# Patient Record
Sex: Female | Born: 1935 | Race: Black or African American | Hispanic: No | Marital: Married | Smoking: Never smoker
Health system: Southern US, Community
[De-identification: ages and names within clinical notes are randomized; demographics above are authoritative.]

## PROBLEM LIST (undated history)

## (undated) DIAGNOSIS — J449 Chronic obstructive pulmonary disease, unspecified: Secondary | ICD-10-CM

## (undated) DIAGNOSIS — K219 Gastro-esophageal reflux disease without esophagitis: Secondary | ICD-10-CM

## (undated) DIAGNOSIS — I441 Atrioventricular block, second degree: Secondary | ICD-10-CM

## (undated) DIAGNOSIS — K449 Diaphragmatic hernia without obstruction or gangrene: Secondary | ICD-10-CM

## (undated) DIAGNOSIS — Z950 Presence of cardiac pacemaker: Secondary | ICD-10-CM

## (undated) DIAGNOSIS — I10 Essential (primary) hypertension: Secondary | ICD-10-CM

## (undated) HISTORY — DX: Essential (primary) hypertension: I10

## (undated) HISTORY — DX: Diaphragmatic hernia without obstruction or gangrene: K44.9

## (undated) HISTORY — DX: Gastro-esophageal reflux disease without esophagitis: K21.9

## (undated) HISTORY — PX: PACEMAKER PLACEMENT: SHX43

## (undated) HISTORY — DX: Atrioventricular block, second degree: I44.1

## (undated) HISTORY — PX: KNEE SURGERY: SHX244

## (undated) HISTORY — DX: Chronic obstructive pulmonary disease, unspecified: J44.9

---

## 1998-12-20 ENCOUNTER — Ambulatory Visit (HOSPITAL_BASED_OUTPATIENT_CLINIC_OR_DEPARTMENT_OTHER): Admission: RE | Admit: 1998-12-20 | Discharge: 1998-12-21 | Payer: Self-pay | Admitting: Orthopedic Surgery

## 1998-12-20 HISTORY — PX: ESOPHAGOGASTRODUODENOSCOPY: SHX1529

## 1999-01-03 ENCOUNTER — Encounter: Admission: RE | Admit: 1999-01-03 | Discharge: 1999-01-30 | Payer: Self-pay | Admitting: Orthopedic Surgery

## 1999-06-13 ENCOUNTER — Ambulatory Visit (HOSPITAL_COMMUNITY): Admission: RE | Admit: 1999-06-13 | Discharge: 1999-06-13 | Payer: Self-pay | Admitting: Family Medicine

## 1999-11-20 ENCOUNTER — Encounter: Admission: RE | Admit: 1999-11-20 | Discharge: 1999-11-20 | Payer: Self-pay | Admitting: Family Medicine

## 1999-11-20 ENCOUNTER — Encounter: Payer: Self-pay | Admitting: Family Medicine

## 2000-02-15 ENCOUNTER — Ambulatory Visit (HOSPITAL_COMMUNITY): Admission: RE | Admit: 2000-02-15 | Discharge: 2000-02-15 | Payer: Self-pay | Admitting: Pulmonary Disease

## 2000-02-21 ENCOUNTER — Encounter: Payer: Self-pay | Admitting: Pulmonary Disease

## 2000-02-21 ENCOUNTER — Ambulatory Visit (HOSPITAL_COMMUNITY): Admission: RE | Admit: 2000-02-21 | Discharge: 2000-02-21 | Payer: Self-pay | Admitting: Pulmonary Disease

## 2000-12-09 ENCOUNTER — Encounter: Admission: RE | Admit: 2000-12-09 | Discharge: 2000-12-09 | Payer: Self-pay | Admitting: Family Medicine

## 2000-12-09 ENCOUNTER — Encounter: Payer: Self-pay | Admitting: Family Medicine

## 2000-12-12 ENCOUNTER — Ambulatory Visit (HOSPITAL_COMMUNITY): Admission: RE | Admit: 2000-12-12 | Discharge: 2000-12-12 | Payer: Self-pay

## 2001-04-15 ENCOUNTER — Encounter: Admission: RE | Admit: 2001-04-15 | Discharge: 2001-04-15 | Payer: Self-pay | Admitting: Family Medicine

## 2001-04-15 ENCOUNTER — Encounter: Payer: Self-pay | Admitting: Family Medicine

## 2001-09-29 ENCOUNTER — Ambulatory Visit (HOSPITAL_COMMUNITY): Admission: RE | Admit: 2001-09-29 | Discharge: 2001-09-29 | Payer: Self-pay | Admitting: Cardiology

## 2002-02-11 ENCOUNTER — Encounter: Payer: Self-pay | Admitting: Family Medicine

## 2002-02-11 ENCOUNTER — Encounter: Admission: RE | Admit: 2002-02-11 | Discharge: 2002-02-11 | Payer: Self-pay | Admitting: Family Medicine

## 2002-06-24 ENCOUNTER — Encounter: Payer: Self-pay | Admitting: Emergency Medicine

## 2002-06-24 ENCOUNTER — Emergency Department (HOSPITAL_COMMUNITY): Admission: EM | Admit: 2002-06-24 | Discharge: 2002-06-24 | Payer: Self-pay | Admitting: Emergency Medicine

## 2002-06-26 ENCOUNTER — Ambulatory Visit (HOSPITAL_BASED_OUTPATIENT_CLINIC_OR_DEPARTMENT_OTHER): Admission: RE | Admit: 2002-06-26 | Discharge: 2002-06-26 | Payer: Self-pay | Admitting: Pulmonary Disease

## 2002-07-26 ENCOUNTER — Encounter: Admission: RE | Admit: 2002-07-26 | Discharge: 2002-07-26 | Payer: Self-pay | Admitting: Orthopedic Surgery

## 2002-07-26 ENCOUNTER — Encounter: Payer: Self-pay | Admitting: Orthopedic Surgery

## 2002-09-22 ENCOUNTER — Ambulatory Visit (HOSPITAL_COMMUNITY): Admission: RE | Admit: 2002-09-22 | Discharge: 2002-09-22 | Payer: Self-pay | Admitting: Gastroenterology

## 2002-09-22 HISTORY — PX: COLONOSCOPY: SHX174

## 2003-02-15 ENCOUNTER — Encounter: Admission: RE | Admit: 2003-02-15 | Discharge: 2003-02-15 | Payer: Self-pay | Admitting: Family Medicine

## 2004-05-03 ENCOUNTER — Encounter: Admission: RE | Admit: 2004-05-03 | Discharge: 2004-05-03 | Payer: Self-pay | Admitting: Family Medicine

## 2004-09-21 ENCOUNTER — Ambulatory Visit (HOSPITAL_COMMUNITY): Admission: RE | Admit: 2004-09-21 | Discharge: 2004-09-21 | Payer: Self-pay | Admitting: Cardiology

## 2005-01-23 ENCOUNTER — Inpatient Hospital Stay (HOSPITAL_COMMUNITY): Admission: EM | Admit: 2005-01-23 | Discharge: 2005-01-25 | Payer: Self-pay | Admitting: Emergency Medicine

## 2005-01-25 ENCOUNTER — Encounter (INDEPENDENT_AMBULATORY_CARE_PROVIDER_SITE_OTHER): Payer: Self-pay | Admitting: Cardiology

## 2005-02-21 ENCOUNTER — Encounter: Admission: RE | Admit: 2005-02-21 | Discharge: 2005-05-22 | Payer: Self-pay | Admitting: Family Medicine

## 2005-05-22 ENCOUNTER — Ambulatory Visit (HOSPITAL_COMMUNITY): Admission: RE | Admit: 2005-05-22 | Discharge: 2005-05-22 | Payer: Self-pay | Admitting: Sports Medicine

## 2005-05-22 ENCOUNTER — Encounter: Admission: RE | Admit: 2005-05-22 | Discharge: 2005-07-20 | Payer: Self-pay | Admitting: Sports Medicine

## 2005-05-24 ENCOUNTER — Encounter: Admission: RE | Admit: 2005-05-24 | Discharge: 2005-08-22 | Payer: Self-pay | Admitting: Family Medicine

## 2005-05-28 ENCOUNTER — Encounter: Admission: RE | Admit: 2005-05-28 | Discharge: 2005-05-28 | Payer: Self-pay | Admitting: Family Medicine

## 2005-07-21 ENCOUNTER — Emergency Department (HOSPITAL_COMMUNITY): Admission: EM | Admit: 2005-07-21 | Discharge: 2005-07-21 | Payer: Self-pay | Admitting: Emergency Medicine

## 2006-04-16 ENCOUNTER — Encounter: Admission: RE | Admit: 2006-04-16 | Discharge: 2006-04-16 | Payer: Self-pay | Admitting: Family Medicine

## 2006-07-25 ENCOUNTER — Encounter: Admission: RE | Admit: 2006-07-25 | Discharge: 2006-07-25 | Payer: Self-pay | Admitting: Family Medicine

## 2007-11-12 ENCOUNTER — Encounter: Admission: RE | Admit: 2007-11-12 | Discharge: 2007-11-12 | Payer: Self-pay | Admitting: Family Medicine

## 2008-02-04 ENCOUNTER — Ambulatory Visit: Payer: Self-pay | Admitting: Pulmonary Disease

## 2008-02-04 DIAGNOSIS — K219 Gastro-esophageal reflux disease without esophagitis: Secondary | ICD-10-CM | POA: Insufficient documentation

## 2008-04-12 ENCOUNTER — Other Ambulatory Visit: Admission: RE | Admit: 2008-04-12 | Discharge: 2008-04-12 | Payer: Self-pay | Admitting: Obstetrics and Gynecology

## 2008-04-29 ENCOUNTER — Ambulatory Visit: Payer: Self-pay | Admitting: Surgery

## 2008-04-29 ENCOUNTER — Ambulatory Visit: Admission: RE | Admit: 2008-04-29 | Discharge: 2008-04-29 | Payer: Self-pay | Admitting: Cardiology

## 2008-04-29 ENCOUNTER — Encounter (INDEPENDENT_AMBULATORY_CARE_PROVIDER_SITE_OTHER): Payer: Self-pay | Admitting: Cardiology

## 2008-05-25 ENCOUNTER — Encounter: Payer: Self-pay | Admitting: Internal Medicine

## 2008-07-28 ENCOUNTER — Encounter: Payer: Self-pay | Admitting: Internal Medicine

## 2008-08-12 ENCOUNTER — Encounter: Payer: Self-pay | Admitting: Internal Medicine

## 2008-09-07 ENCOUNTER — Encounter: Payer: Self-pay | Admitting: Internal Medicine

## 2008-10-07 ENCOUNTER — Encounter: Payer: Self-pay | Admitting: Internal Medicine

## 2008-10-21 DIAGNOSIS — I10 Essential (primary) hypertension: Secondary | ICD-10-CM | POA: Insufficient documentation

## 2008-10-21 DIAGNOSIS — Z8719 Personal history of other diseases of the digestive system: Secondary | ICD-10-CM

## 2008-10-22 ENCOUNTER — Ambulatory Visit: Payer: Self-pay | Admitting: Internal Medicine

## 2008-10-22 DIAGNOSIS — I498 Other specified cardiac arrhythmias: Secondary | ICD-10-CM

## 2008-11-09 ENCOUNTER — Telehealth (INDEPENDENT_AMBULATORY_CARE_PROVIDER_SITE_OTHER): Payer: Self-pay | Admitting: *Deleted

## 2008-12-08 ENCOUNTER — Encounter: Payer: Self-pay | Admitting: Internal Medicine

## 2009-02-11 ENCOUNTER — Encounter: Admission: RE | Admit: 2009-02-11 | Discharge: 2009-02-11 | Payer: Self-pay | Admitting: Family Medicine

## 2010-02-13 ENCOUNTER — Encounter
Admission: RE | Admit: 2010-02-13 | Discharge: 2010-02-13 | Payer: Self-pay | Source: Home / Self Care | Attending: Family Medicine | Admitting: Family Medicine

## 2010-03-09 ENCOUNTER — Ambulatory Visit
Admission: RE | Admit: 2010-03-09 | Discharge: 2010-03-09 | Payer: Self-pay | Source: Home / Self Care | Attending: Critical Care Medicine | Admitting: Critical Care Medicine

## 2010-03-09 ENCOUNTER — Ambulatory Visit (HOSPITAL_BASED_OUTPATIENT_CLINIC_OR_DEPARTMENT_OTHER)
Admission: RE | Admit: 2010-03-09 | Discharge: 2010-03-09 | Payer: Self-pay | Source: Home / Self Care | Attending: Critical Care Medicine | Admitting: Critical Care Medicine

## 2010-03-09 ENCOUNTER — Telehealth: Payer: Self-pay | Admitting: Critical Care Medicine

## 2010-03-09 DIAGNOSIS — G4733 Obstructive sleep apnea (adult) (pediatric): Secondary | ICD-10-CM | POA: Insufficient documentation

## 2010-03-20 ENCOUNTER — Encounter: Payer: Self-pay | Admitting: Family Medicine

## 2010-03-30 NOTE — Progress Notes (Signed)
Summary: CXR normal  Phone Note Outgoing Call   Reason for Call: Discuss lab or test results Summary of Call: call the pt and tell her CXR normal, no active disease, no pneumonia Initial call taken by: Storm Frisk MD,  March 09, 2010 2:58 PM  Follow-up for Phone Call        [facility]  Crystal Yetta Barre RN  March 09, 2010 3:33 PM  Called, spoke with pt.  She was informed of above cxr results per PW and verbalized understanding. Follow-up by: Gweneth Dimitri RN,  March 10, 2010 1:49 PM

## 2010-03-30 NOTE — Assessment & Plan Note (Signed)
Summary: Pulmonary Consultation   Copy to:  Dr. Catha Gosselin Primary Provider/Referring Provider:  Dr. Clarene Duke  CC:  Pulmonary Follow up - abnormal PFT, SOB., and dyspnea.  History of Present Illness: Pulmonary Consultation       This is a 77 year old female who presents with dyspnea.  The patient complains of shortness of breath, but denies history of diagnosed COPD, chest tightness, chest pain worse with breathing and coughing, wheezing, cough, mucous production, nocturnal awakening, exercise induced symptoms, and congestion.  The cough is described as insignificant.  The dyspnea is described as dyspnea at rest, dyspnea with exertion, worse with inspiration, and occurs while bending over.  The patient denies any history of asthma, allergic rhinitis, COPD, sleep disordered breathing, obstructive sleep apnea, heart disease, thyroid disease, anemia, collagen vascular disease, osteporosis, kyphoscoliosis, occupational exposure: , and cancer: .         Ineffective therapies have included oral inhaled steroids.    This pt has been dyspneic for 6-35months ,  getting worse overtime.  Pt not able to sleep at night. ?irriated throat from inhaler. no cough,  just slow breathing no real chest pain  pfts show severe obstrution Pt was on symbicort and this irritated the throat Pt on diflucan for oral thrush   Preventive Screening-Counseling & Management  Alcohol-Tobacco     Smoking Status: never  Current Medications (verified): 1)  Nexium 40 Mg Cpdr (Esomeprazole Magnesium) .... Take 1 Capsule By Mouth Once A Day 2)  Klor-Con 20 Meq Pack (Potassium Chloride) .... Take 1 Tablet By Mouth Two Times A Day 3)  Metoclopramide Hcl 5 Mg Tabs (Metoclopramide Hcl) .... Take 1 Tablet By Mouth Two Times A Day 4)  Alprazolam 0.5 Mg Tabs (Alprazolam) .... 1/2 Tablet At Bedtime 5)  Furosemide 20 Mg Tabs (Furosemide) .... Take 1 Tablet By Mouth Two Times A Day 6)  Clonidine Hcl 0.2 Mg Tabs (Clonidine Hcl) ....  Take 1 Tablet By Mouth Three Times A Day 7)  Aspirin 81 Mg Tbec (Aspirin) .... Take One Tablet By Mouth Daily 8)  Metamucil 30.9 % Powd (Psyllium) .... Two Times A Day 9)  Metoprolol Tartrate 50 Mg Tabs (Metoprolol Tartrate) .... Take 1 Tablet By Mouth Once A Day 10)  Lisinopril 20 Mg Tabs (Lisinopril) .... 2 Tablets in Am and 1 in Evening.  Allergies: 1)  ! Sulfa 2)  ! * Latex 3)  ! Penicillin 4)  ! Codeine 5)  * Latex  Past History:  Past medical, surgical, family and social histories (including risk factors) reviewed, and no changes noted (except as noted below).  Past Medical History: Reviewed history from 10/22/2008 and no changes required. HYPERTENSION (ICD-401.9) HIATAL HERNIA, HX OF (ICD-V12.79) G E R D (ICD-530.81) Mobitz I second degree AV block  caratacts removed  Past Surgical History:  PHYSICIAN:  Petra Kuba, M.D.                   DATE OF PROCEDURE:  09/22/2002                                  OPERATIVE REPORT   PROCEDURE:  Colonoscopy.    PHYSICIAN:  Petra Kuba, M.D.            DATE OF PROCEDURE:  09/22/2002  OPERATIVE REPORT   PROCEDURE:  Esophagogastroduodenoscopy.  Attending Physician:  Cornell Barman Proc. Date: 12/20/98 Admit Date:  12/20/1998                             Operative Report  PREOPERATIVE DIAGNOSIS:  Partial rotator cuff tear, right shoulder.  POSTOPERATIVE DIAGNOSIS:  Partial rotator cuff tear, right shoulder.  OPERATION:  Neer anterior one third acromioplasty and repair partial tear supraspinatus, right shoulder.    right and left knee surgery pacemaker  Family History: Reviewed history from 10/22/2008 and no changes required.  The patient's mother died of breast cancer at age 61.  Dad  died of CVA and hypertension complications at age 80.  A half sister died of  CVA and hypertension.  Social History: Reviewed history from 10/22/2008 and no changes required. Pt lives  in [place] with her spouse.   Retired, previously worked in Designer, fashion/clothing.  She denies tobacco, ETOH, drugs  Review of Systems       The patient complains of shortness of breath with activity, shortness of breath at rest, irregular heartbeats, indigestion, sore throat, nasal congestion/difficulty breathing through nose, anxiety, depression, hand/feet swelling, and joint stiffness or pain.  The patient denies productive cough, non-productive cough, coughing up blood, chest pain, acid heartburn, loss of appetite, weight change, abdominal pain, difficulty swallowing, tooth/dental problems, headaches, sneezing, itching, ear ache, rash, change in color of mucus, and fever.        See HPI for Pulmonary, Cardiac, General, and ENT review of systems.  Vital Signs:  Patient profile:   77 year old female Height:      64 inches Weight:      194 pounds BMI:     33.42 O2 Sat:      99 % on Room air Temp:     97.9 degrees F oral Pulse rate:   60 / minute BP sitting:   130 / 60  (right arm) Cuff size:   large  Vitals Entered By: Gweneth Dimitri RN (March 09, 2010 1:28 PM)  O2 Flow:  Room air CC: Pulmonary Follow up - abnormal PFT, SOB., dyspnea Comments Medications reviewed with patient Daytime contact number verified with patient. Gweneth Dimitri RN  March 09, 2010 1:35 PM    Physical Exam  Additional Exam:  Gen: Pleasant, well-nourished, in no distress,  normal affect ENT: No lesions,  mouth clear,  oropharynx clear, no postnasal drip Neck: No JVD, no TMG, no carotid bruits Lungs: No use of accessory muscles, no dullness to percussion, distant BS Cardiovascular: RRR, heart sounds normal, no murmur or gallops, no peripheral edema Abdomen: soft and NT, no HSM,  BS normal Musculoskeletal: No deformities, no cyanosis or clubbing Neuro: alert, non focal Skin: Warm, no lesions or rashes    CXR  Procedure date:  03/09/2010  Findings:      IMPRESSION: No active disease.  Pulmonary  Function Test Date: 03/06/2010 Gender: Female  Pre-Spirometry FVC    Value: 1.17 L/min   Pred: 2.31 L/min     % Pred: 51 % FEV1    Value: 0.96 L     Pred: 1.58 L     % Pred: 61 % FEV1/FVC  Value: 70 %     Pred: 82 %    FEF 25-75  Value: 0.91 L/min   Pred: 2.03 L/min     % Pred: 45 %  Comments: severe airflow obstruction   Impression &  Recommendations:  Problem # 1:  DYSPNEA (ICD-786.05) Assessment Deteriorated Dyspnea on the basis of severe Copd,  no active pneumonia plan Trial Spiriva one capsule two puff daily Stop symbicort Finish diflucan Orders: Est. Patient Level V ([address]) T-2 View CXR (71020TC)  Medications Added to Medication List This Visit: 1)  Nexium 40 Mg Cpdr (Esomeprazole magnesium) .... Take 1 capsule by mouth once a day 2)  Klor-con 20 Meq Pack (Potassium chloride) .... Take 1 tablet by mouth two times a day 3)  Metoclopramide Hcl 5 Mg Tabs (Metoclopramide hcl) .... Take 1 tablet by mouth two times a day 4)  Alprazolam 0.5 Mg Tabs (Alprazolam) .... 1/2 tablet at bedtime 5)  Furosemide 20 Mg Tabs (Furosemide) .... Take 1 tablet by mouth two times a day 6)  Clonidine Hcl 0.2 Mg Tabs (Clonidine hcl) .... Take 1 tablet by mouth three times a day 7)  Metamucil 30.9 % Powd (Psyllium) .... Two times a day 8)  Metoprolol Tartrate 50 Mg Tabs (Metoprolol tartrate) .... Take 1 tablet by mouth once a day 9)  Lisinopril 20 Mg Tabs (Lisinopril) .... 2 tablets in am and 1 in evening. 10)  Spiriva Handihaler 18 Mcg Caps (Tiotropium bromide monohydrate) .... Two puffs in handihaler daily  Complete Medication List: 1)  Nexium 40 Mg Cpdr (Esomeprazole magnesium) .... Take 1 capsule by mouth once a day 2)  Klor-con 20 Meq Pack (Potassium chloride) .... Take 1 tablet by mouth two times a day 3)  Metoclopramide Hcl 5 Mg Tabs (Metoclopramide hcl) .... Take 1 tablet by mouth two times a day 4)  Alprazolam 0.5 Mg Tabs (Alprazolam) .... 1/2 tablet at bedtime 5)  Furosemide 20 Mg Tabs  (Furosemide) .... Take 1 tablet by mouth two times a day 6)  Clonidine Hcl 0.2 Mg Tabs (Clonidine hcl) .... Take 1 tablet by mouth three times a day 7)  Aspirin 81 Mg Tbec (Aspirin) .... Take one tablet by mouth daily 8)  Metamucil 30.9 % Powd (Psyllium) .... Two times a day 9)  Metoprolol Tartrate 50 Mg Tabs (Metoprolol tartrate) .... Take 1 tablet by mouth once a day 10)  Lisinopril 20 Mg Tabs (Lisinopril) .... 2 tablets in am and 1 in evening. 11)  Spiriva Handihaler 18 Mcg Caps (Tiotropium bromide monohydrate) .... Two puffs in handihaler daily  Patient Instructions: 1)  Trial Spiriva one capsule two puff daily 2)  Stop symbicort 3)  Finish diflucan 4)  Chest xray today 5)  Return 2 months for recheck Prescriptions: SPIRIVA HANDIHALER 18 MCG  CAPS (TIOTROPIUM BROMIDE MONOHYDRATE) Two puffs in handihaler daily  #30 x 6   Entered and Authorized by:   Storm Frisk MD   Signed by:   Storm Frisk MD on 03/09/2010   Method used:   Electronically to        [facility] [phone]* (retail)       [address]       [place], [place]  [address]       Ph: [phone]       Fax: [phone]   RxID:   [id number]    Immunization History:  Influenza Immunization History:    Influenza:  historical (01/26/2010)   Prevention & Chronic Care Immunizations   Influenza vaccine: Historical  (01/26/2010)    Tetanus booster: Not documented    Pneumococcal vaccine: Not documented    H. zoster vaccine: Not documented  Colorectal Screening   Hemoccult: Not documented  Colonoscopy: Not documented  Other Screening   Pap smear: Not documented    Mammogram: Not documented    DXA bone density scan: Not documented   Smoking status: never  (03/09/2010)  Lipids   Total Cholesterol: Not documented   LDL: Not documented   LDL Direct: Not documented   HDL: Not documented   Triglycerides: Not documented  Hypertension   Last Blood Pressure: 130 / 60  (03/09/2010)   Serum  creatinine: Not documented   Serum potassium Not documented  Self-Management Support :    Hypertension self-management support: Not documented  Appended Document: Pulmonary Consultation fax kevin little

## 2010-04-14 ENCOUNTER — Encounter: Payer: Self-pay | Admitting: Critical Care Medicine

## 2010-04-17 ENCOUNTER — Telehealth (INDEPENDENT_AMBULATORY_CARE_PROVIDER_SITE_OTHER): Payer: Self-pay | Admitting: *Deleted

## 2010-04-25 NOTE — Progress Notes (Signed)
Summary: side effects from spiriva  Phone Note Call from Patient Call back at Home Phone [phone]   Caller: Patient Call For: wright Summary of Call: Pt states she experiencing side effects from spiriva hoarseness, sore throat since 1/13 pls advise.//[facility] [place] Initial call taken by: Darletta Moll,  April 17, 2010 12:08 PM  Follow-up for Phone Call        Called, spoke with pt.  States she was seen at PCP office on Friday d/t sore throat and hoarseness.  Onset since starting spiriva on 03/09/10 - was told to call Dr. Delford Field by PCP's office bc sxs probably are side effect from spiriva.  Pt aware PW out of office but will have doc of day address.  Dr. Craige Cotta, pls advise.  Thanks! Follow-up by: Gweneth Dimitri RN,  April 17, 2010 1:33 PM  Additional Follow-up for Phone Call Additional follow up Details #1::        Please have her stop spiriva.  Send script for ventolin two puffs four times per day as needed for cough, wheeze, congestion, or shortness of breath.  Advise her to call if her symptoms get worse.  Will forward to Dr. Delford Field for his review. Additional Follow-up by: Coralyn Helling MD,  April 17, 2010 1:39 PM    Additional Follow-up for Phone Call Additional follow up Details #2::    Called, spoke with pt.  She was informed to stop spiriva and start ventolin 2 puffs qid as needed cough, wheezing, congestion, SOB per Dr. Craige Cotta.  She verbalized understanding of this and was advised to call office back if sxs worsen.  Aware ventolin rx sent to [facility] and will forward message to PW to reveiw of any further recs when he returns to office.  Pt verbalized understanding of these instructions. Follow-up by: Gweneth Dimitri RN,  April 17, 2010 1:51 PM  Additional Follow-up for Phone Call Additional follow up Details #3:: Details for Additional Follow-up Action Taken: agree , stop spiriva and make OV for appt Additional Follow-up by: Storm Frisk MD,  April 17, 2010 7:12 PM  New/Updated Medications: VENTOLIN HFA 108 (90 BASE) MCG/ACT AERS (ALBUTEROL SULFATE) 2 puffs four times daily as needed for cough, wheeze, congestion, shortness of breath Prescriptions: VENTOLIN HFA 108 (90 BASE) MCG/ACT AERS (ALBUTEROL SULFATE) 2 puffs four times daily as needed for cough, wheeze, congestion, shortness of breath  #1 x 3   Entered by:   Gweneth Dimitri RN   Authorized by:   Coralyn Helling MD   Signed by:   Gweneth Dimitri RN on 04/17/2010   Method used:   Electronically to        [facility] [phone]* (retail)       [address]       [place], [place]  [address]       Ph: [phone]       Fax: [phone]   RxID:   [id number]  Pt informed Dr Delford Field okay with change over to Ventolin.  Pt will call back to schedule f/u appt. Abigail Miyamoto RN  April 18, 2010 8:53 AM

## 2010-05-04 NOTE — Letter (Signed)
Summary: Gweneth Dimitri MD/[facility]  Gweneth Dimitri MD/[facility]   Imported By: Lester Belk 04/24/2010 09:33:18  _____________________________________________________________________  External Attachment:    Type:   Image     Comment:   External Document

## 2010-05-11 ENCOUNTER — Ambulatory Visit (INDEPENDENT_AMBULATORY_CARE_PROVIDER_SITE_OTHER): Payer: Medicare PPO | Admitting: Critical Care Medicine

## 2010-05-11 ENCOUNTER — Encounter: Payer: Self-pay | Admitting: Critical Care Medicine

## 2010-05-11 DIAGNOSIS — J40 Bronchitis, not specified as acute or chronic: Secondary | ICD-10-CM

## 2010-05-25 NOTE — Assessment & Plan Note (Signed)
Summary: Pulmonary OV   Copy to:  Dr. Catha Gosselin Primary Provider/Referring Provider:  Dr. Clarene Duke  CC:  2 month follow up.  Pt states breathing is better when she uses ventolin.  Cough - prod at times with a small amount of white mucus.  .  History of Present Illness: Pulmonary OV  Chronic dyspnea.  Cycical cough.  Severe obstruction on PFTs       Ineffective therapies have included oral inhaled steroids.    This pt has been dyspneic for 6-36months ,  getting worse overtime.  Pt not able to sleep at night. ?irriated throat from inhaler. no cough,  just slow breathing no real chest pain  pfts show severe obstrution Pt was on symbicort and this irritated the throat Pt on diflucan for oral thrush   May 11, 2010 4:17 PM Not able to tolerate spiriva due to side effects.  No real cough.  Dyspnea is at baseline. Pt intolerant of all HFA/DPI inhalers.  Clinical Reports Reviewed:  PFT's:  03/09/2010: FEF 25/75 %Predicted:  45 FEV1 %Predicted:  61 FVC %Predicted:  51   Current Medications (verified): 1)  Nexium 40 Mg Cpdr (Esomeprazole Magnesium) .... Take 1 Capsule By Mouth Once A Day 2)  Klor-Con 20 Meq Pack (Potassium Chloride) .... Take 1 Tablet By Mouth Two Times A Day 3)  Metoclopramide Hcl 5 Mg Tabs (Metoclopramide Hcl) .... Take 1 Tablet By Mouth Two Times A Day 4)  Alprazolam 0.5 Mg Tabs (Alprazolam) .... 1/2 Tablet At Bedtime 5)  Furosemide 20 Mg Tabs (Furosemide) .... Take 1 Tablet By Mouth Two Times A Day 6)  Clonidine Hcl 0.2 Mg Tabs (Clonidine Hcl) .... Take 1 Tablet By Mouth Three Times A Day 7)  Aspirin 81 Mg Tbec (Aspirin) .... Take One Tablet By Mouth Daily 8)  Metamucil 30.9 % Powd (Psyllium) .... Two Times A Day 9)  Metoprolol Tartrate 50 Mg Tabs (Metoprolol Tartrate) .... Take 1 Tablet By Mouth Once A Day 10)  Lisinopril 20 Mg Tabs (Lisinopril) .... 2 Tablets in Am and 1 in Evening. 11)  Ventolin Hfa 108 (90 Base) Mcg/act Aers (Albuterol Sulfate) .... 2  Puffs Four Times Daily As Needed For Cough, Wheeze, Congestion, Shortness of Breath  Allergies (verified): 1)  ! Sulfa 2)  ! * Latex 3)  ! Penicillin 4)  ! Codeine 5)  * Latex  Past History:  Past medical, surgical, family and social histories (including risk factors) reviewed, and no changes noted (except as noted below).  Past Medical History: HYPERTENSION (ICD-401.9) HIATAL HERNIA, HX OF (ICD-V12.79) G E R D (ICD-530.81) Mobitz I second degree AV block  caratacts removed Moderate COPD   FeV1 Moderate obstruction  Past Surgical History: Reviewed history from 03/09/2010 and no changes required.  PHYSICIAN:  Petra Kuba, M.D.                   DATE OF PROCEDURE:  09/22/2002                                  OPERATIVE REPORT   PROCEDURE:  Colonoscopy.    PHYSICIAN:  Petra Kuba, M.D.            DATE OF PROCEDURE:  09/22/2002  OPERATIVE REPORT   PROCEDURE:  Esophagogastroduodenoscopy.  Attending Physician:  Cornell Barman Proc. Date: 12/20/98 Admit Date:  12/20/1998                             Operative Report  PREOPERATIVE DIAGNOSIS:  Partial rotator cuff tear, right shoulder.  POSTOPERATIVE DIAGNOSIS:  Partial rotator cuff tear, right shoulder.  OPERATION:  Neer anterior one third acromioplasty and repair partial tear supraspinatus, right shoulder.    right and left knee surgery pacemaker  Family History: Reviewed history from 10/22/2008 and no changes required.  The patient's mother died of breast cancer at age 89.  Dad  died of CVA and hypertension complications at age 66.  A half sister died of  CVA and hypertension.  Social History: Reviewed history from 03/09/2010 and no changes required. Pt lives in [place] with her spouse.   Retired, previously worked in Designer, fashion/clothing.  She denies tobacco, ETOH, drugs  Review of Systems       The patient complains of shortness of breath with activity and  productive cough.  The patient denies shortness of breath at rest, non-productive cough, coughing up blood, chest pain, irregular heartbeats, acid heartburn, indigestion, loss of appetite, weight change, abdominal pain, difficulty swallowing, sore throat, tooth/dental problems, headaches, nasal congestion/difficulty breathing through nose, sneezing, itching, ear ache, anxiety, depression, hand/feet swelling, joint stiffness or pain, rash, change in color of mucus, and fever.    Vital Signs:  Patient profile:   77 year old female Height:      64 inches Weight:      196 pounds BMI:     33.76 O2 Sat:      97 % on Room air Temp:     98.0 degrees F oral Pulse rate:   59 / minute BP sitting:   116 / 56  (right arm) Cuff size:   large  Vitals Entered By: Gweneth Dimitri RN (May 11, 2010 4:13 PM)  O2 Flow:  Room air CC: 2 month follow up.  Pt states breathing is better when she uses ventolin.  Cough - prod at times with a small amount of white mucus.   Comments Medications reviewed with patient Daytime contact number verified with patient. Gweneth Dimitri RN  May 11, 2010 4:13 PM    Physical Exam  Additional Exam:  Gen: Pleasant, well-nourished, in no distress,  normal affect ENT: No lesions,  mouth clear,  oropharynx clear, no postnasal drip Neck: No JVD, no TMG, no carotid bruits Lungs: No use of accessory muscles, no dullness to percussion, distant BS Cardiovascular: RRR, heart sounds normal, no murmur or gallops, no peripheral edema Abdomen: soft and NT, no HSM,  BS normal Musculoskeletal: No deformities, no cyanosis or clubbing Neuro: alert, non focal Skin: Warm, no lesions or rashes    Impression & Recommendations:  Problem # 1:  BRONCHITIS (ICD-490) Assessment Improved No true emphysema,  pos for asthmatic bronchits now stable plan d/c symbicort/spiriva use SABA as needed only Her updated medication list for this problem includes:    Ventolin Hfa 108 (90 Base) Mcg/act  Aers (Albuterol sulfate) .Marland Kitchen... 2 puffs four times daily as needed for cough, wheeze, congestion, shortness of breath  Orders: Est. Patient Level III ([address])  Complete Medication List: 1)  Nexium 40 Mg Cpdr (Esomeprazole magnesium) .... Take 1 capsule by mouth once a day 2)  Klor-con 20 Meq Pack (Potassium chloride) .... Take 1 tablet by  mouth two times a day 3)  Metoclopramide Hcl 5 Mg Tabs (Metoclopramide hcl) .... Take 1 tablet by mouth two times a day 4)  Alprazolam 0.5 Mg Tabs (Alprazolam) .... 1/2 tablet at bedtime 5)  Furosemide 20 Mg Tabs (Furosemide) .... Take 1 tablet by mouth two times a day 6)  Clonidine Hcl 0.2 Mg Tabs (Clonidine hcl) .... Take 1 tablet by mouth three times a day 7)  Aspirin 81 Mg Tbec (Aspirin) .... Take one tablet by mouth daily 8)  Metamucil 30.9 % Powd (Psyllium) .... Two times a day 9)  Metoprolol Tartrate 50 Mg Tabs (Metoprolol tartrate) .... Take 1 tablet by mouth once a day 10)  Lisinopril 20 Mg Tabs (Lisinopril) .... 2 tablets in am and 1 in evening. 11)  Ventolin Hfa 108 (90 Base) Mcg/act Aers (Albuterol sulfate) .... 2 puffs four times daily as needed for cough, wheeze, congestion, shortness of breath  Patient Instructions: 1)  You do not have emphysema,  you have asthmatic bronchitis, now is stable 2)  Stay off symbicort and spiriva 3)  Use ventolin 1-2 puffs every 4-6 hours as needed shortness of breath 4)  No other medication changes 5)  Return [place] 4 months   Appended Document: Pulmonary OV fax kevin little

## 2010-06-26 ENCOUNTER — Ambulatory Visit: Payer: Medicare PPO | Admitting: Family Medicine

## 2010-07-14 NOTE — Cardiovascular Report (Signed)
NAMESelia, Kara Sanders                           ACCOUNT NO.:  [id number]   MEDICAL RECORD NO.:  [id number]                   PATIENT TYPE:   LOCATION:                                       FACILITY:   PHYSICIAN:  Traci R. Mayford Knife, M.D.               DATE OF BIRTH:  September 26, 1928   DATE OF PROCEDURE:  09/29/2001  DATE OF DISCHARGE:                              CARDIAC CATHETERIZATION   PROCEDURE:  Left heart catheterization, coronary angiography, left  ventriculogram.   OPERATOR:  Traci R. Mayford Knife, M.D.   INDICATIONS:  Chest pain.   COMPLICATIONS:  None.   INTRAVENOUS ACCESS:  Via right femoral artery, 6 French sheath.   HISTORY OF PRESENT ILLNESS:  This is a 77 year old black female with a  history of hypertension, as well as diastolic dysfunction regarding her  shortness of breath who has been having episodes of chest pain recently.  Adenosine Cardiolite study showed no evidence of inducible ischemia, but  patient continues to have chest pain. She now presents for cardiac  catheterization.   DESCRIPTION OF PROCEDURE:  The patient is brought to the cardiac  catheterization laboratory in the fasting non sedated state. Informed  consent was obtained. The patient was connected to continuous heart rate and  pulse oximetry monitoring, and intermittent blood pressure monitoring. The  right groin was prepped and draped in a sterile fashion. Then, 1% Xylocaine  was used for local anesthesia. Using the modified Seldinger technique, a 6  French sheath was placed in the right femoral artery. Under fluoroscopic  guidance a 6 [place] JL4 catheter was placed in the left coronary artery.  Multiple cine films were taken in a 30-degree RAO, 40-degree LAO views. This  catheter was then exchanged out over a guide wire for a 6 [place] JR4  catheter, which was attempted to be placed in right coronary ostium but  unsuccessful. This catheter was then exchanged out for a 6 [place] non torque  right  coronary artery catheter which was placed under fluoroscopic guidance  in the right coronary artery. Multiple cine films were taken in a 30-degree  RAO, 40-degree LAO views. This catheter was then exchanged out over a guide  wire for a 6 French angled pigtail catheter, which was placed in the left  ventricular cavity. Left ventriculography was performed in a 30-degree RAO  view using a total of 24 cc of contrast at 12 cc/sec. The catheter was then  pulled back across the aortic valve and no significant gradient noted.  The  catheter was then removed over a guide wire. The patient was given one  sublingual nitroglycerin because of hypertension and then all catheters and  sheaths were removed. Manual compression was performed until adequate  hemostasis was obtained. The patient was transferred back to the room in  stable condition.   RESULTS:  1. Left main coronary artery is widely patent and bifurcates into a left  anterior descending artery and left circumflex artery.  2. The left anterior descending artery is widely patent throughout its     course giving rise to two diagonal branches, both of which are widely     patent.  3. The left circumflex is widely patent throughout its course and traverses     the arteriovenous groove and  gives rise to one large obtuse marginal     branch, which bifurcates distally into two branches, both of which are     widely patent.  4. The right coronary artery is widely patent throughout its course and     bifurcates distal in the posterior descending artery and posterolateral     artery.   LEFT VENTRICULOGRAPHY:  Left ventriculography performed in a 30-degree RAO  using a total of 24 cc of contrast at 12 cc/sec. showed normal LV function.  Aortic pressure 193/89 mmHg, left ventricular pressure 193/26 mmHg.   ASSESSMENT:  1. Noncardiac chest pain.  2. Normal coronary arteries.  3. Normal left ventricular function.  4. Hypertension with elevated  blood pressure.   PLAN:  Discharge to home after IV fluids and bed rest.  A course of blood  pressure control, GI work-up for chest pain. Followup with Dr. Mayford Knife in two  weeks, increase lisinopril to 40 mg a day.                                               Traci R. Mayford Knife, M.D.    TRT/MEDQ  D:  09/29/2001  T:  10/03/2001  Job:  [id number]   cc:   Caryn Bee L. Little, M.D.   Danice Goltz, MD [facility]  [address] [place], [place] [address]  Fax: [phone]

## 2010-07-14 NOTE — Discharge Summary (Signed)
NAMECHLOE, Kara Sanders              ACCOUNT NO.:  [id number]   MEDICAL RECORD NO.:  [id number]          PATIENT TYPE:  INP   LOCATION:  4733                         FACILITY:  [facility]   PHYSICIAN:  Armanda Magic, M.D.     DATE OF BIRTH:  20-Dec-1928   DATE OF ADMISSION:  01/23/2005  DATE OF DISCHARGE:  01/25/2005                                 DISCHARGE SUMMARY   CARDIOLOGIST:  Dr. Mayford Knife.   PRIMARY CARE PHYSICIAN:  Dr. Catha Gosselin.   CHIEF COMPLAINT AND REASON FOR ADMISSION:  Ms. Dewalt is a 77 year old female  patient with a history of diastolic dysfunction.  She has a longstanding  history of hypertension.  On the date of admission, she went to see her  dentist in [facility].  She had no specific manipulation or therapies  initiated.  She had had some soreness in her mouth and they just looked in  her mouth.  She had received no medications at the dentist's office nor had  changed or done anything different with her medications at home on the date  of admission.  On her way out to the car after leaving the dentist, she  became very weak and felt as though she might faint.  By the time she got to  the car, she had to hold onto the car to keep from falling.  She did not  lose consciousness.  She was able to make it into the car and sat there for  about 15 minutes before she felt better.  She drove home, told her family  what had happened and they encouraged her to come to the emergency room to  be evaluated.  In the ER, she was stable and asymptomatic, and was found to  have a second-degree Mobitz type I block on her EKG.  It is noted that the  patient, in addition to Norvasc, diuretics and ACE inhibitor for blood  pressure control, is also on Toprol-XL 25 mg tablets three per day.  In the  ER, her BP was 135/76, heart rate 62.  Exam was otherwise unremarkable.  D-  dimer was mildly elevated at 1.48.  Chest x-ray was normal, point-of-care  enzymes negative x2.  CT of the head showed  mild atrophy and small vessel  disease, no acute changes.   ADMISSION DIAGNOSES:  1.  Near-syncope, cause uncertain, rule out neurally mediated syncope versus      bradyarrhythmia.  2.  Elevated D-dimer, rule out pulmonary embolus, possible etiology for      syncope as well.  3.  Hypertension, controlled.  4.  Left ventricular diastolic dysfunction.   HOSPITAL COURSE:  PROBLEM #1 - SYNCOPE:  The patient was admitted to the  telemetry unit to monitor for additional bradyarrhythmias.  Beta blockers  were placed on hold after further evaluation per Dr. Mayford Knife.  By the  following day, the patient was feeling much better.  She had had some mild  chest tightness the evening prior, but otherwise this had resolved.  She  continued to have intermittent problems with second-degree block, although  most of the time  she was in sinus rhythm with first-degree A-V block and  sinus bradycardia.  QTc was stable at 447 msec.  Her beta blockers remained  on hold.  Her cardiac isoenzymes were cycled and these were within normal  limits.   Because of the near-syncope, neurological versus cardiac etiology needed to  be determined.  The patient subsequently underwent bilateral carotid  Dopplers; these were within normal limits.  She had antegrade flow in the  vertebral artery.  She had a 2-D echocardiogram done which was stable with  an LVEF of 55% to 65%.  Doppler parameters were consistent with diastolic  dysfunction, mild AR and mild MR.  She also underwent a stress Cardiolite  study and this showed no ischemia.  At this time she is deemed appropriate  for discharge home with holding of her beta blockers, suspect that this is  the etiology of her heart block.  On discharge, her BP was 122/64, pulse 64  and regular.   PROBLEM #2 - ELEVATED D-DIMER, RULE OUT PULMONARY EMBOLUS:  CT angiography  of the chest was done and this showed no evidence of pulmonary embolus.   ADDITIONAL PERTINENT LABORATORY  DATA:  A TSH was checked and this is 1.763.  BNP 71.6.   FINAL DISCHARGE DIAGNOSES:  1.  Near-syncope secondary to second-degree atrioventricular block, resolved      off beta blockers.  2.  Diastolic dysfunction with normal left ventricular ejection fraction, no      evidence of heart failure, this admission.  3.  Elevated D-dimer with negative CT of the chest for a pulmonary embolus.  4.  Hypertension, currently controlled.  5.  Gastroesophageal reflux disease.   DISCHARGE MEDICATIONS:  1.  Stop Toprol-XL.  2.  Nexium 40 mg daily.  3.  Centrum Silver one daily.  4.  Metoclopramide 5 mg twice daily.  5.  Hyoscyamine 0.325 mg twice daily.  6.  Alprazolam 0.25 mg daily at bedtime.  7.  Norvasc 120 mg daily.  8.  Triamterene/hydrochlorothiazide 37.5/25 mg daily.  9.  Benazepril 40 mg daily.   DIET:  Heart-healthy.   ACTIVITY:  No restrictions.   WOUND CARE:  Not applicable.   FOLLOWUP APPOINTMENT:  She is to see Dr. Mayford Knife in the office on February 09, 2005 at 12:45 p.m.   COMMENT:  I have reviewed these instructions with the patient and she agrees  and will follow.      Allison L. Ulla Gallo, M.D.  Electronically Signed   ALE/MEDQ  D:  01/25/2005  T:  01/26/2005  Job:  [id number]   cc:   Caryn Bee L. Little, M.D.  Fax: [phone]

## 2010-07-14 NOTE — Op Note (Signed)
   NAME:  Kara Sanders, Kara Sanders                    ACCOUNT NO.:  [id number]   MEDICAL RECORD NO.:  [id number]                   PATIENT TYPE:  AMB   LOCATION:  ENDO                                 FACILITY:  [facility]   PHYSICIAN:  Petra Kuba, M.D.                 DATE OF BIRTH:  1929-01-21   DATE OF PROCEDURE:  09/22/2002  DATE OF DISCHARGE:                                 OPERATIVE REPORT   PROCEDURE:  Colonoscopy.   INDICATIONS:  Screening.  Consent was signed after risks, benefits, methods,  and options thoroughly discussed in the office.   MEDICATIONS:  Additional medicines for this procedure since it followed the  endoscopy, 10 mg of Demerol and 1.5 mg of Versed.   DESCRIPTION OF PROCEDURE:  Rectal inspection was pertinent for external  hemorrhoids, small.  Digital exam was negative.  The video colonoscope was  inserted, easily advanced around the colon to the cecum.  This did require  some abdominal pressure and no position changes.  No abnormality was seen on  insertion.  The cecum was then entered, identified by the appendiceal  orifice and the ileocecal valve.  The scope was slowly withdrawn.  The prep  was adequate.  There was some liquid stool that required washing and  suctioning but on slow withdrawal through the colon, other than some rare  left-sided diverticula no abnormalities were seen, specifically no polyps,  tumors, or masses.  Anorectal pull-through and retroflexion in the rectum  confirmed some small hemorrhoids.  The scope was straightened, air was  suctioned, the scope removed.  The patient tolerated the procedure well.  There was no obvious immediate complication.   ENDOSCOPIC DIAGNOSES:  1. Internal-external hemorrhoids.  2. Rare left-sided diverticula.  3. Otherwise within normal limits to the cecum.    PLAN:  Yearly rectals and guaiacs per Dr. Clarene Duke.  Happy to see back p.r.n.  or in six months.  Consider repeat screening in five to 10 years if  doing  well medically, but otherwise yearly rectals and guaiacs per Dr. Clarene Duke.                                               Petra Kuba, M.D.    MEM/MEDQ  D:  09/22/2002  T:  09/23/2002  Job:  [id number]   cc:   Caryn Bee L. Little, M.D.  [address]  [place]  [place] [address]  Fax: [phone]   Armanda Magic, M.D.  [address], [address]  [place], [place] [address]  Fax: [phone]   Danice Goltz, M.D. [facility]

## 2010-07-14 NOTE — Op Note (Signed)
   NAME:  Kara Sanders, Kara Sanders                    ACCOUNT NO.:  [id number]   MEDICAL RECORD NO.:  [id number]                   PATIENT TYPE:  AMB   LOCATION:  ENDO                                 FACILITY:  [facility]   PHYSICIAN:  Petra Kuba, M.D.                 DATE OF BIRTH:  1928/04/01   DATE OF PROCEDURE:  09/22/2002  DATE OF DISCHARGE:                                 OPERATIVE REPORT   PROCEDURE:  Esophagogastroduodenoscopy.   ENDOSCOPIST:  Petra Kuba, M.D.   INDICATIONS FOR PROCEDURE:  Upper GI tract symptoms, atypical reflux.  Consent was signed after the risks, benefits, methods, and options were  thoroughly discussed in the office.   PREMEDICATIONS:  Demerol 60 mg, Versed 6 mg.   DESCRIPTION OF PROCEDURE:  The video endoscope was inserted by direct  vision.  The esophagus was normal.  In the distal esophagus, was a tiny  hiatal hernia.  The scope was inserted into the stomach and advanced through  a normal antrum, normal pylorus, into a normal duodenal bulb and around the  celiac to a normal second portion of the duodenum.  The scope was withdrawn  back to the bulb and a good look there ruled out abnormalities in that  location.   The stomach was evaluated on straight and retroflexed visualization with a  good look at the angularis, cardia, fundus, lesser and greater curvatures.  It was noted there was minimal gastritis.  No abnormalities were seen. Air  was suctioned.  The scope was slowly withdrawn.  Again, a good look at the  esophagus was normal without signs of reflux or Barrett's.  The scope was  removed.   The patient tolerated the procedure well.  There was no obvious immediate  complication.   ENDOSCOPIC DIAGNOSES:  1. Tiny hiatal hernia.  2. Minimal gastritis.  3. Otherwise, normal esophagogastroduodenoscopy.    PLAN:  Continue her p.m. Reglan and pump inhibitors as they seem to be  helping and continue workup with screening colonoscopy.  Please see  H&P for  further recommendations, workups, and plans.                                               Petra Kuba, M.D.    MEM/MEDQ  D:  09/22/2002  T:  09/22/2002  Job:  [id number]   cc:   Armanda Magic, M.D.  [address]  [place], [place] [address]  Fax: [phone]   Anna Genre. Little, M.D.  [address]  [place]  [place] [address]  Fax: [phone]   Danice Goltz, M.D. [facility]

## 2010-07-14 NOTE — H&P (Signed)
Kara Sanders, Kara Sanders              ACCOUNT NO.:  [id number]   MEDICAL RECORD NO.:  [id number]          PATIENT TYPE:  INP   LOCATION:  1823                         FACILITY:  [facility]   PHYSICIAN:  Lyn Records, M.D.   DATE OF BIRTH:  1928/03/19   DATE OF ADMISSION:  01/23/2005  DATE OF DISCHARGE:                                HISTORY & PHYSICAL   REASON FOR ADMISSION:  Syncope, transient A-V block.   SUBJECTIVE:  The patient is 76 years of age and had a prior cardiac  evaluation by Dr. Armanda Magic in the past and has been told that her heart  muscle was stiff.  She has longstanding history of hypertension.  She  visited her dentist today in [facility] and had no manipulation or specific  therapy.  He simply looked in her mouth.  On her way out to her car, she  became very weak and felt as though she might faint.  By the time she got to  her car, she had to hold onto the car to keep from falling.  She did not  lose consciousness.  She was able to make it into the car, and she sat there  for 15 minutes before she started to feel better.  She drove home and, after  talking to family, was encouraged to come to the emergency room.   In the ER, no specific abnormalities were found other than transient A-V  block, second degree, felt to be Mobitz I.  She was asymptomatic with this.  This was not sustained.  She is admitted to the hospital at this time for  further evaluation.   ALLERGIES:  SULFA, CODEINE, and PENICILLIN.   PAST MEDICAL HISTORY:  1.  Hiatal hernia.  2.  Hypertension.  3.  Gastroesophageal reflux disease.  4.  History of bronchitis.  5.  Left total knee operation.  6.  Left foot surgery.  7.  Bilateral cataract surgery.   HABITS:  Does not smoke or drink.   MEDICATIONS:  1.  Nexium 40 mg per day.  2.  K-Dur 20 mEq per day.  3.  Toprol XL 25 mg tablets 3 per day.  4.  Centrum Silver 1 per day.  5.  Metoclopramide 5 mg twice a day.  6.  Hyoscyamine 0.375 mg  twice a day.  7.  Alprazolam 0.25 mg at bedtime daily.  8.  Norvasc 10 mg per day.  9.  Triamterene/hydrochlorothiazide 37.5/25 mg per day.  10. Benazepril 40 mg per day.   FAMILY HISTORY:  The patient's mother died of breast cancer at age 33.  Dad  died of CVA and hypertension complications at age 4.  A half sister died of  CVA and hypertension.   REVIEW OF SYSTEMS:  She has a sore mouth in the region where her partial  denture is placed, hence her reason for seen the dentist today.  Her weight  has been stable.  She has not previously had syncope.  No specific side  effect to her medications.   OBJECTIVE:  VITAL SIGNS: Blood pressure 135/76, heart rate  62, O2 saturation  99% on room air.  SKIN:  Warm and dry.  HEENT:  Exam is unremarkable.  CHEST: Clear to auscultation and percussion.  CARDIAC:  No murmur, no rub, no click, no gallop.  Extremities reveal trace  edema on the right, 1+ angle edema on the left.  ABDOMEN: Soft.  Bowel sounds are normal.  No masses are noted.  NEUROLOGIC: No motor or sensory deficits.   Chest x-ray: No acute changes.   D-dimer 1.48.  Point-of-care cardiac markers negative x2.  Other laboratory  data unremarkable.   CT of the head revealed mild atrophy and small vessel disease.  (This test  was ordered by the ER physician.)   EKG: Sinus bradycardia with first degree A-V block.  Rhythm strip revealed  second degree A-V block (Mobitz I) that was transient, otherwise  unremarkable and was totally asymptomatic.   ASSESSMENT:  1.  Near syncope, cause uncertain.  Etiologies include the possibility of      neurally mediated syncope; high grades of atrioventricular block      producing bradyarrhythmia and near syncope; possibly pulmonary embolism,      but this seems doubtful given the patient's normal oxygen saturation;      however, with elevated D-dimer, this cannot be totally excluded.  2.  Hypertension.  3.  Left ventricular diastolic  dysfunction.   PLAN:  1.  Admit.  2.  Monitor for bradyarrhythmias.  3.  CT angiogram of the lungs to rule to pulmonary embolism.  4.  Hold beta blocker therapy.  5.  Further workup per Dr. Mayford Knife.      Lyn Records, M.D.  Electronically Signed     HWS/MEDQ  D:  01/23/2005  T:  01/23/2005  Job:  [id number]   cc:   Armanda Magic, M.D.  Fax: [phone]   Anna Genre. Little, M.D.  Fax: [phone]

## 2010-07-14 NOTE — Op Note (Signed)
[facility]  Patient:    Kara Sanders Visit Number: [id number] MRN: [id number]          Service Type: DSU Location: [facility] Attending Physician:  Cornell Barman Proc. Date: 12/20/98 Admit Date:  12/20/1998                             Operative Report  PREOPERATIVE DIAGNOSIS:  Partial rotator cuff tear, right shoulder.  POSTOPERATIVE DIAGNOSIS:  Partial rotator cuff tear, right shoulder.  OPERATION:  Neer anterior one third acromioplasty and repair partial tear supraspinatus, right shoulder.  SURGEON:  Lenard Galloway. Chaney Malling, M.D.  ANESTHESIA:  General.  DESCRIPTION OF PROCEDURE:  After satisfactory oral endotracheal anesthesia, the  patient was placed in a semi-seated position.  The right upper extremity and shoulder were then prepped with DuraPrep and draped out in the usual manner.  A  saber-cut incision made over the anterolateral aspect of the shoulder. Bleeders were coagulated, and a self-retaining retractor was inserted.  The deltoid fibers were released off of the anterior and anterolateral aspect of the tip of the acromion.  Skin edges were retracted.  Subacromial space was opened, and fluid as found in the subacromial space.  Using a power saw, a very generous Neer anterior one third acromioplasty.  Once this was completed, this gave excellent access to the shoulder.  A partial tear of the rotator cuff was seen with abrasion of the  rotator cuff.  Antibiotic solution was injected into the glenohumeral joint and  through the frayed portion of the rotator cuff.  Fluid extravasated.  This area of the cuff was then excised elliptically and closed with heavy Tycron sutures.  A  water tight closure was achieved.  Throughout the procedure, the shoulder was irrigated with copious amounts of antibiotic solution.  The deltoid fibers were  then reattached with #0 Vicryl and 2-0 Vicryl was used to close the subcutaneous tissue.   Stainless steel staples were then used to close the skin, and sterile dressings were applied.  The patient then returned to the recovery room in excellent condition.  Technically, this procedure went extremely well.  DRAINS:  None.  COMPLICATIONS:  None. Attending Physician:  Cornell Barman DD:  12/20/98 TD:  12/21/98 Job: [id number]

## 2010-08-02 ENCOUNTER — Encounter: Payer: Self-pay | Admitting: *Deleted

## 2010-08-02 ENCOUNTER — Ambulatory Visit: Payer: Medicare PPO | Admitting: Adult Health

## 2010-08-02 ENCOUNTER — Telehealth: Payer: Self-pay | Admitting: Critical Care Medicine

## 2010-08-02 NOTE — Telephone Encounter (Signed)
Called spoke with patient who stated that she missed her appt today with TP because she was unaware that it was in [place] rather than [place] which is where she is normally seen.  I apologized for the confusion and attempted to [facility] patient for a date next week as TP and PW are out of the office until 6.12.12.  Pt stated that she is unable to come to the [place] office so appt scheduled with TP for 6.19.12 @ 1115 in [place].  Pt states that her breathing is doing well right now and that Dr Clarene Duke was concerned about her using her rescue inhaler 4 times daily, but states states she used it twice yesterday and once today.  Advised pt that if she develops problems with her breathing before her appt to call the office.  Pt verbalized her understanding.  Reiterated to patient several times the date of her upcoming appt, the location and who she will be seeing.

## 2010-08-09 ENCOUNTER — Encounter: Payer: Self-pay | Admitting: Adult Health

## 2010-08-10 ENCOUNTER — Inpatient Hospital Stay: Payer: Medicare PPO | Admitting: Adult Health

## 2010-08-15 ENCOUNTER — Ambulatory Visit (INDEPENDENT_AMBULATORY_CARE_PROVIDER_SITE_OTHER): Payer: Medicare PPO | Admitting: Adult Health

## 2010-08-15 ENCOUNTER — Encounter: Payer: Self-pay | Admitting: Adult Health

## 2010-08-15 VITALS — BP 102/60 | HR 64 | Temp 97.8°F | Ht 63.5 in | Wt 200.5 lb

## 2010-08-15 DIAGNOSIS — J45909 Unspecified asthma, uncomplicated: Secondary | ICD-10-CM

## 2010-08-15 MED ORDER — VALSARTAN 160 MG PO TABS: 160.0000 mg | ORAL_TABLET | Freq: Every day | ORAL | Status: DC

## 2010-08-15 MED ORDER — PREDNISONE 10 MG PO TABS: ORAL_TABLET | ORAL | Status: AC

## 2010-08-15 NOTE — Patient Instructions (Signed)
STOP LISINOPRIL  BEGIN DIOVAN 160MG  DAILY -FOR BLOOD PRESSURE  DELSYM 2 TSP At bedtime  FOR COUGH  PREDNISONE TAPER OVER NEXT WEEK.-TAKE WITH FOOD IN AM  NO MINTS TRY NOT TO COUGH /CLEAR THROAT  follow up IN 4 WEEKS Dr. Delford Field  [place]  Please contact office for sooner follow up if symptoms do not improve or worsen or seek emergency care

## 2010-08-15 NOTE — Progress Notes (Signed)
  Subjective:    Patient ID: Kara Sanders, female    DOB: [birth year], 77 y.o.   MRN: [id number]  HPI 77 yo Female never smoker with seen for initial pulmonary consult on 02/2010 for cough/dyspnea  -dx w/ recurrent  Asthmatic Bronchitis PFTS 02/2010 showed FEF 25/75 %Predicted: 45 , FEV1 %Predicted: 61 , FVC %Predicted: 51   02/2010-Pulmonary consult  Chronic dyspnea. Cycical cough. Severe obstruction on PFTs  Ineffective therapies have included oral inhaled steroids.  This pt has been dyspneic for 6-86months , getting worse overtime. Pt not able to sleep at night.  ?irriated throat from inhaler.  no cough, just slow breathing  no real chest pain  pfts show severe obstrution  Pt was on symbicort and this irritated the throat  Pt on diflucan for oral thrush  CXR No acute finding  May 11, 2010 4:17 PM  Not able to tolerate spiriva due to side effects. No real cough. Dyspnea is at baseline. Pt intolerant of all HFA/DPI inhalers.  >Inhaler d/c    08/15/10 Acute OV  Pt complains of Pt c/o intermittent SOB all the time, wheezing, chest tightness. Has shortness of  breath with activity and night mostly. Wheezing intermittently along w/ dry cough, increased at night . On average uses ventolin 4 x day .  Of note she is on an ACE inhibitor for b/p control.  No discolored mucus or fever.    Review of Systems Constitutional:   No  weight loss, night sweats,  Fevers, chills, fatigue, or  lassitude.  HEENT:   No headaches,  Difficulty swallowing,  Tooth/dental problems, or  Sore throat,                No sneezing, itching, ear ache, nasal congestion, post nasal drip,   CV:  No chest pain,  Orthopnea, PND, swelling in lower extremities, anasarca, dizziness, palpitations, syncope.   GI  No heartburn, indigestion, abdominal pain, nausea, vomiting, diarrhea, change in bowel habits, loss of appetite, bloody stools.   Resp:   No coughing up of blood.    No chest wall deformity  Skin: no rash or  lesions.  GU: no dysuria, change in color of urine, no urgency or frequency.  No flank pain, no hematuria   MS:  No joint pain or swelling.  No decreased range of motion.    back  Psych:  No change in mood or affect. No depression or anxiety.            Objective:   Physical Exam GEN: A/Ox3; pleasant , NAD, elderly   HEENT:  [place]/AT,  EACs-clear, TMs-wnl, NOSE-clear, THROAT-clear, no lesions, no postnasal drip or exudate noted.   NECK:  Supple w/ fair ROM; no JVD; normal carotid impulses w/o bruits; no thyromegaly or nodules palpated; no lymphadenopathy.  RESP  Coarse BS w/ upper airway wheeze on forced expirationno accessory muscle use, no dullness to percussion  CARD:  RRR, no m/r/g  , no peripheral edema, pulses intact, no cyanosis or clubbing.  GI:   Soft & nt; nml bowel sounds; no organomegaly or masses detected.  Musco: Warm bil, no deformities or joint swelling noted.   Neuro: alert, no focal deficits noted.    Skin: Warm, no lesions or rashes         Assessment & Plan:

## 2010-08-21 DIAGNOSIS — J45909 Unspecified asthma, uncomplicated: Secondary | ICD-10-CM | POA: Insufficient documentation

## 2010-08-21 NOTE — Assessment & Plan Note (Addendum)
Recurrent cough w/ wheezing . PFTs show obstruction in nonsmoker cxr 02/2010 neg for acute process .  May have asthma component, however has significant upper airway irritation with psueodwheezing  Pt is on ACE inhibitor which may be making cough /wheezing worse  For now will treat cough and triggers , on return will need to repeat spirometry in ~6 weeks to check PFT off ACE if cough is improved  May need maintence if still symptomatic off ACE +/- Methacholine challenge test if unclear if asthma  May also need sinus CT to r/o sinus dz. If not improving  Plan STOP LISINOPRIL  BEGIN DIOVAN 160MG  DAILY -FOR BLOOD PRESSURE  DELSYM 2 TSP At bedtime  FOR COUGH  PREDNISONE TAPER OVER NEXT WEEK.-TAKE WITH FOOD IN AM  NO MINTS TRY NOT TO COUGH /CLEAR THROAT  follow up IN 4 WEEKS Dr. Delford Field  IN [place]  Please contact office for sooner follow up if symptoms do not improve or worsen or seek emergency care

## 2010-09-21 ENCOUNTER — Encounter: Payer: Self-pay | Admitting: Critical Care Medicine

## 2010-09-21 ENCOUNTER — Ambulatory Visit (INDEPENDENT_AMBULATORY_CARE_PROVIDER_SITE_OTHER): Payer: Medicare PPO | Admitting: Critical Care Medicine

## 2010-09-21 VITALS — BP 110/60 | HR 60 | Temp 98.4°F | Ht 64.0 in | Wt 201.0 lb

## 2010-09-21 DIAGNOSIS — J45909 Unspecified asthma, uncomplicated: Secondary | ICD-10-CM

## 2010-09-21 MED ORDER — LOSARTAN POTASSIUM 50 MG PO TABS: 50.0000 mg | ORAL_TABLET | Freq: Every day | ORAL | Status: DC

## 2010-09-21 MED ORDER — PREDNISONE 10 MG PO TABS: ORAL_TABLET | ORAL | Status: DC

## 2010-09-21 NOTE — Progress Notes (Signed)
Subjective:    Patient ID: Kara Sanders, female    DOB: Apr 06, 1928, 77 y.o.   MRN: [id number]  HPI  77 yo Female never smoker with seen for initial pulmonary consult on 02/2010 for cough/dyspnea  -dx w/ recurrent  Asthmatic Bronchitis PFTS 02/2010 showed FEF 25/75 %Predicted: 45 , FEV1 %Predicted: 61 , FVC %Predicted: 51   02/2010-Pulmonary consult  Chronic dyspnea. Cycical cough. Severe obstruction on PFTs  Ineffective therapies have included oral inhaled steroids.  This pt has been dyspneic for 6-47months , getting worse overtime. Pt not able to sleep at night.  ?irriated throat from inhaler.  no cough, just slow breathing  no real chest pain  pfts show severe obstrution  Pt was on symbicort and this irritated the throat  Pt on diflucan for oral thrush  CXR No acute finding  May 11, 2010 4:17 PM  Not able to tolerate spiriva due to side effects. No real cough. Dyspnea is at baseline. Pt intolerant of all HFA/DPI inhalers.  >Inhaler d/c    08/15/10 Acute OV  Pt complains of Pt c/o intermittent SOB all the time, wheezing, chest tightness. Has shortness of  breath with activity and night mostly. Wheezing intermittently along w/ dry cough, increased at night . On average uses ventolin 4 x day .  Of note she is on an ACE inhibitor for b/p control.  No discolored mucus or fever.   09/21/2010 Went to Urgent care over weekend.  Pred for 8 days.    Also on benedryl.  Pred has helped.  Notes some mucus and is white.  Notes some wheeze especially at night  Review of Systems  Constitutional:   No  weight loss, night sweats,  Fevers, chills, fatigue, or  lassitude.  HEENT:   No headaches,  Difficulty swallowing,  Tooth/dental problems, or  Sore throat,                No sneezing, itching, ear ache, nasal congestion, post nasal drip,   CV:  No chest pain,  Orthopnea, PND, swelling in lower extremities, anasarca, dizziness, palpitations, syncope.   GI  No heartburn, indigestion,  abdominal pain, nausea, vomiting, diarrhea, change in bowel habits, loss of appetite, bloody stools.   Resp:   No coughing up of blood.    No chest wall deformity  Skin: no rash or lesions.  GU: no dysuria, change in color of urine, no urgency or frequency.  No flank pain, no hematuria   MS:  No joint pain or swelling.  No decreased range of motion.    back  Psych:  No change in mood or affect. No depression or anxiety.            Objective:   Physical Exam  GEN: A/Ox3; pleasant , NAD, elderly   HEENT:  [place]/AT,  EACs-clear, TMs-wnl, NOSE-clear, THROAT-clear, no lesions, no postnasal drip or exudate noted.   NECK:  Supple w/ fair ROM; no JVD; normal carotid impulses w/o bruits; no thyromegaly or nodules palpated; no lymphadenopathy.  RESP  Coarse BS w/ upper airway wheeze on forced expirationno accessory muscle use, no dullness to percussion  CARD:  RRR, no m/r/g  , no peripheral edema, pulses intact, no cyanosis or clubbing.  GI:   Soft & nt; nml bowel sounds; no organomegaly or masses detected.  Musco: Warm bil, no deformities or joint swelling noted.   Neuro: alert, no focal deficits noted.    Skin: Warm, no lesions or rashes  Assessment & Plan:   Asthmatic bronchitis Asthmatic bronchitis with flare Plan Shift diovan to losartan generic Repulse prednisone No other changes     Updated Medication List Outpatient Encounter Prescriptions as of 09/21/2010  Medication Sig Dispense Refill  . ALPRAZolam (XANAX) 0.5 MG tablet 1/2 tab by mouth at bedtime      . amLODipine (NORVASC) 10 MG tablet Take 10 mg by mouth daily.        Marland Kitchen aspirin 81 MG tablet Take 81 mg by mouth daily.        . cloNIDine (CATAPRES) 0.2 MG tablet Take 1 tablet by mouth Three times a day.      . furosemide (LASIX) 20 MG tablet Take 1 tablet by mouth Twice daily.      . hydrochlorothiazide 25 MG tablet Take 25 mg by mouth daily.        Marland Kitchen losartan (COZAAR) 50 MG tablet Take 1 tablet (50  mg total) by mouth daily.  30 tablet  6  . metoCLOPramide (REGLAN) 5 MG tablet Take 5 mg by mouth 2 (two) times daily.       . metoprolol succinate (TOPROL-XL) 25 MG 24 hr tablet Take 25 mg by mouth daily.        Marland Kitchen NEXIUM 40 MG capsule Take 1 capsule by mouth daily before breakfast.      . potassium chloride SA (K-DUR,KLOR-CON) 20 MEQ tablet Take 20 mEq by mouth 2 (two) times daily.        . Psyllium (METAMUCIL) 30.9 % POWD 2 (two) times daily.        . VENTOLIN HFA 108 (90 BASE) MCG/ACT inhaler Take 2 puffs by mouth 4 (four) times daily as needed.      Marland Kitchen DISCONTD: predniSONE (DELTASONE) 20 MG tablet Taper as directed      . DISCONTD: valsartan (DIOVAN) 160 MG tablet Take 1 tablet (160 mg total) by mouth daily.  30 tablet  11  . predniSONE (DELTASONE) 10 MG tablet Take 4 for three days, 3 for three days, 2 for three days, 1 for three days and stop   30 tablet  0

## 2010-09-21 NOTE — Patient Instructions (Addendum)
Use proair as needed After you finish your current prednisone prescription, take another round of prednisone 10mg   Take 4 for three days, 3 for three days, 2 for three days, 1 for three days and stop Use diovan samples until gone then start losartan 50mg  daily for blood pressure.  I will let Dr Clarene Duke know about this change No other medication changes Return [place] 6 weeks

## 2010-09-23 NOTE — Assessment & Plan Note (Signed)
Asthmatic bronchitis with flare Plan Shift diovan to losartan generic Repulse prednisone No other changes

## 2010-09-26 ENCOUNTER — Telehealth: Payer: Self-pay | Admitting: Critical Care Medicine

## 2010-09-26 NOTE — Telephone Encounter (Signed)
Spoke with pt. She states that she is needing sample of proair, b/c she can not afford rx until next wk and states that her breathing has been worse with hot weather. She states it is no worse since the lat visit here on 09/21/10. I checked with Jen in [place] and there are no samples there for any rescue inhaler. I advised that if she can come here to [facility] office, I can leave her a ventolin sample. Pt okay with this, and directions to [place] office given. Sample up front.

## 2010-10-17 ENCOUNTER — Telehealth: Payer: Self-pay | Admitting: Critical Care Medicine

## 2010-10-17 NOTE — Telephone Encounter (Signed)
ATC NA and no option to leave msg, WCB 

## 2010-10-17 NOTE — Telephone Encounter (Signed)
Pt aware no sample available but when i called pt back she said she was mistaken that her inhaler was not empty

## 2010-10-18 ENCOUNTER — Telehealth: Payer: Self-pay | Admitting: Critical Care Medicine

## 2010-10-18 MED ORDER — ALBUTEROL SULFATE HFA 108 (90 BASE) MCG/ACT IN AERS: 2.0000 | INHALATION_SPRAY | Freq: Four times a day (QID) | RESPIRATORY_TRACT | Status: AC | PRN

## 2010-10-18 NOTE — Telephone Encounter (Signed)
Pt advised refill sent. Carron Curie, CMA

## 2010-11-02 ENCOUNTER — Ambulatory Visit: Payer: Medicare PPO | Admitting: Critical Care Medicine

## 2010-11-14 ENCOUNTER — Encounter: Payer: Self-pay | Admitting: Critical Care Medicine

## 2010-11-14 ENCOUNTER — Ambulatory Visit (INDEPENDENT_AMBULATORY_CARE_PROVIDER_SITE_OTHER): Payer: Medicare PPO | Admitting: Critical Care Medicine

## 2010-11-14 DIAGNOSIS — J45909 Unspecified asthma, uncomplicated: Secondary | ICD-10-CM

## 2010-11-14 MED ORDER — MOMETASONE FURO-FORMOTEROL FUM 100-5 MCG/ACT IN AERO: 2.0000 | INHALATION_SPRAY | Freq: Two times a day (BID) | RESPIRATORY_TRACT | Status: AC

## 2010-11-14 MED ORDER — AEROCHAMBER MV MISC: Status: AC

## 2010-11-14 NOTE — Patient Instructions (Signed)
Try Dulera two puff twice daily with spacer, use samples Return 1 month [place]

## 2010-11-14 NOTE — Progress Notes (Signed)
Subjective:    Patient ID: Kara Sanders, female    DOB: 1928-07-08, 77 y.o.   MRN: [id number]  HPI  77 y.o.  Female never smoker with seen for initial pulmonary consult on 02/2010 for cough/dyspnea  -dx w/ recurrent  Asthmatic Bronchitis PFTS 02/2010 showed FEF 25/75 %Predicted: 45 , FEV1 %Predicted: 61 , FVC %Predicted: 51   02/2010-Pulmonary consult  Chronic dyspnea. Cycical cough. Severe obstruction on PFTs  Ineffective therapies have included oral inhaled steroids.  This pt has been dyspneic for 6-40months , getting worse overtime. Pt not able to sleep at night.  ?irriated throat from inhaler.  no cough, just slow breathing  no real chest pain  pfts show severe obstrution  Pt was on symbicort and this irritated the throat  Pt on diflucan for oral thrush  CXR No acute finding  May 11, 2010 4:17 PM  Not able to tolerate spiriva due to side effects. No real cough. Dyspnea is at baseline. Pt intolerant of all HFA/DPI inhalers.  >Inhaler d/c    08/15/10 Acute OV  Pt complains of Pt c/o intermittent SOB all the time, wheezing, chest tightness. Has shortness of  breath with activity and night mostly. Wheezing intermittently along w/ dry cough, increased at night . On average uses ventolin 4 x day .  Of note she is on an ACE inhibitor for b/p control.  No discolored mucus or fever.   7/26 Went to Urgent care over weekend.  Pred for 8 days.    Also on benedryl.  Pred has helped.  Notes some mucus and is white.  Notes some wheeze especially at night  11/15/2010 Since last ov was in [facility]  X 8days, d/c 7days ago.  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  Now on coumadin. Dr Clarene Duke is managing the coumadin.  Since then still with dyspnea.  No real cough.  Notes severe wheeze esp at night.  Short of breath at rest . No real chest pain.      Past Medical History  Diagnosis Date  . HTN (hypertension)   . Hiatal hernia   . GERD (gastroesophageal reflux disease)   . Mobitz type 2 second  degree AV block   . COPD, moderate      History reviewed. No pertinent family history.   History   Social History  . Marital Status: Married    Spouse Name: N/A    Number of Children: N/A  . Years of Education: N/A   Occupational History  . Not on file.   Social History Main Topics  . Smoking status: Never Smoker   . Smokeless tobacco: Never Used  . Alcohol Use: No  . Drug Use: Not on file  . Sexually Active: Not on file   Other Topics Concern  . Not on file   Social History Narrative  . No narrative on file     Allergies  Allergen Reactions  . Ciprofloxacin Nausea Only  . Codeine   . Latex     REACTION: itching  . Penicillins   . Spiriva Handihaler Itching    Itching, sore throat  . Sulfonamide Derivatives      Outpatient Prescriptions Prior to Visit  Medication Sig Dispense Refill  . albuterol (VENTOLIN HFA) 108 (90 BASE) MCG/ACT inhaler Inhale 2 puffs into the lungs every 6 (six) hours as needed.  1 Inhaler  6  . ALPRAZolam (XANAX) 0.5 MG tablet 1/2 tab by mouth at bedtime      .  cloNIDine (CATAPRES) 0.2 MG tablet Take 1 tablet by mouth Three times a day.      . hydrochlorothiazide 25 MG tablet Take 25 mg by mouth daily.        . metoCLOPramide (REGLAN) 5 MG tablet Take 5 mg by mouth 2 (two) times daily.       Marland Kitchen NEXIUM 40 MG capsule Take 1 capsule by mouth daily before breakfast.      . potassium chloride SA (K-DUR,KLOR-CON) 20 MEQ tablet Take 20 mEq by mouth 2 (two) times daily.        . Psyllium (METAMUCIL) 30.9 % POWD 2 (two) times daily.        Marland Kitchen amLODipine (NORVASC) 10 MG tablet Take 10 mg by mouth daily.        Marland Kitchen aspirin 81 MG tablet Take 81 mg by mouth daily.        . furosemide (LASIX) 20 MG tablet Take 1 tablet by mouth Twice daily.      Marland Kitchen losartan (COZAAR) 50 MG tablet Take 1 tablet (50 mg total) by mouth daily.  30 tablet  6  . metoprolol succinate (TOPROL-XL) 25 MG 24 hr tablet Take 25 mg by mouth daily.        . predniSONE (DELTASONE) 10 MG  tablet Take 4 for three days, 3 for three days, 2 for three days, 1 for three days and stop   30 tablet  0     Review of Systems  Constitutional:   No  weight loss, night sweats,  Fevers, chills, fatigue, or  lassitude.  HEENT:   No headaches,  Difficulty swallowing,  Tooth/dental problems, or  Sore throat,                No sneezing, itching, ear ache, nasal congestion, post nasal drip,   CV:  No chest pain,  Orthopnea, PND, swelling in lower extremities, anasarca, dizziness, palpitations, syncope.   GI  No heartburn, indigestion, abdominal pain, nausea, vomiting, diarrhea, change in bowel habits, loss of appetite, bloody stools.   Resp:   No coughing up of blood.    No chest wall deformity  Skin: no rash or lesions.  GU: no dysuria, change in color of urine, no urgency or frequency.  No flank pain, no hematuria   MS:  No joint pain or swelling.  No decreased range of motion.    back  Psych:  No change in mood or affect. No depression or anxiety.            Objective:   Physical Exam  GEN: A/Ox3; pleasant , NAD, elderly   HEENT:  [place]/AT,  EACs-clear, TMs-wnl, NOSE-clear, THROAT-clear, no lesions, no postnasal drip or exudate noted.   NECK:  Supple w/ fair ROM; no JVD; normal carotid impulses w/o bruits; no thyromegaly or nodules palpated; no lymphadenopathy.  RESP  Coarse BS w/ upper airway wheeze on forced expirationno accessory muscle use, no dullness to percussion  CARD:  RRR, no m/r/g  , no peripheral edema, pulses intact, no cyanosis or clubbing.  GI:   Soft & nt; nml bowel sounds; no organomegaly or masses detected.  Musco: Warm bil, no deformities or joint swelling noted.   Neuro: alert, no focal deficits noted.    Skin: Warm, no lesions or rashes         Assessment & Plan:   Asthmatic bronchitis Past medical bronchitis with known intolerance to dry powdered inhalers Plan Trial of Dulera with AeroChamber  Updated Medication List Outpatient  Encounter Prescriptions as of 11/14/2010  Medication Sig Dispense Refill  . albuterol (VENTOLIN HFA) 108 (90 BASE) MCG/ACT inhaler Inhale 2 puffs into the lungs every 6 (six) hours as needed.  1 Inhaler  6  . ALPRAZolam (XANAX) 0.5 MG tablet 1/2 tab by mouth at bedtime      . amLODipine (NORVASC) 5 MG tablet Take 5 mg by mouth daily.        . cloNIDine (CATAPRES) 0.2 MG tablet Take 1 tablet by mouth Three times a day.      Marland Kitchen dextromethorphan-guaiFENesin (MUCINEX DM) 30-600 MG per 12 hr tablet Take 1 tablet by mouth every 12 (twelve) hours.        . hydrochlorothiazide 25 MG tablet Take 25 mg by mouth daily.        Marland Kitchen losartan (COZAAR) 100 MG tablet Take 100 mg by mouth daily.        . metoCLOPramide (REGLAN) 5 MG tablet Take 5 mg by mouth 2 (two) times daily.       . metoprolol (TOPROL-XL) 50 MG 24 hr tablet Take 25 mg by mouth daily.        Marland Kitchen NEXIUM 40 MG capsule Take 1 capsule by mouth daily before breakfast.      . potassium chloride SA (K-DUR,KLOR-CON) 20 MEQ tablet Take 20 mEq by mouth 2 (two) times daily.        . Psyllium (METAMUCIL) 30.9 % POWD 2 (two) times daily.        Marland Kitchen warfarin (COUMADIN) 5 MG tablet Take 5 mg by mouth as directed.        . mometasone-formoterol (DULERA) 100-5 MCG/ACT AERO Inhale 2 puffs into the lungs 2 (two) times daily.  1 Inhaler  1  . Spacer/Aero-Holding Chambers (AEROCHAMBER MV) inhaler Use as instructed  1 each  0  . DISCONTD: amLODipine (NORVASC) 10 MG tablet Take 10 mg by mouth daily.        Marland Kitchen DISCONTD: aspirin 81 MG tablet Take 81 mg by mouth daily.        Marland Kitchen DISCONTD: furosemide (LASIX) 20 MG tablet Take 1 tablet by mouth Twice daily.      Marland Kitchen DISCONTD: losartan (COZAAR) 50 MG tablet Take 1 tablet (50 mg total) by mouth daily.  30 tablet  6  . DISCONTD: metoprolol succinate (TOPROL-XL) 25 MG 24 hr tablet Take 25 mg by mouth daily.        Marland Kitchen DISCONTD: predniSONE (DELTASONE) 10 MG tablet Take 4 for three days, 3 for three days, 2 for three days, 1 for three days  and stop   30 tablet  0

## 2010-11-15 NOTE — Assessment & Plan Note (Signed)
Past medical bronchitis with known intolerance to dry powdered inhalers Plan Trial of Dulera with AeroChamber

## 2010-12-14 ENCOUNTER — Ambulatory Visit: Payer: Medicare PPO | Admitting: Critical Care Medicine

## 2011-02-28 ENCOUNTER — Other Ambulatory Visit: Payer: Self-pay | Admitting: Family Medicine

## 2011-02-28 DIAGNOSIS — Z1231 Encounter for screening mammogram for malignant neoplasm of breast: Secondary | ICD-10-CM

## 2011-03-15 ENCOUNTER — Ambulatory Visit
Admission: RE | Admit: 2011-03-15 | Discharge: 2011-03-15 | Disposition: A | Discharge status: Home / Self Care | Payer: Medicare PPO | Source: Ambulatory Visit | Attending: Family Medicine | Admitting: Family Medicine

## 2011-03-15 DIAGNOSIS — Z1231 Encounter for screening mammogram for malignant neoplasm of breast: Secondary | ICD-10-CM

## 2012-04-24 ENCOUNTER — Other Ambulatory Visit: Payer: Self-pay

## 2012-04-24 ENCOUNTER — Other Ambulatory Visit: Payer: Self-pay | Admitting: Family Medicine

## 2012-04-24 DIAGNOSIS — Z1231 Encounter for screening mammogram for malignant neoplasm of breast: Secondary | ICD-10-CM

## 2012-05-21 ENCOUNTER — Ambulatory Visit: Payer: Medicare PPO

## 2012-06-18 ENCOUNTER — Ambulatory Visit
Admission: RE | Admit: 2012-06-18 | Discharge: 2012-06-18 | Disposition: A | Discharge status: Home / Self Care | Payer: Medicare HMO | Source: Ambulatory Visit

## 2012-06-18 DIAGNOSIS — Z1231 Encounter for screening mammogram for malignant neoplasm of breast: Secondary | ICD-10-CM

## 2013-08-13 ENCOUNTER — Other Ambulatory Visit: Payer: Self-pay

## 2013-08-13 DIAGNOSIS — Z1231 Encounter for screening mammogram for malignant neoplasm of breast: Secondary | ICD-10-CM

## 2013-08-26 ENCOUNTER — Ambulatory Visit
Admission: RE | Admit: 2013-08-26 | Discharge: 2013-08-26 | Disposition: A | Discharge status: Home / Self Care | Payer: Commercial Managed Care - HMO | Source: Ambulatory Visit

## 2013-08-26 ENCOUNTER — Encounter (INDEPENDENT_AMBULATORY_CARE_PROVIDER_SITE_OTHER): Payer: Self-pay

## 2013-08-26 DIAGNOSIS — Z1231 Encounter for screening mammogram for malignant neoplasm of breast: Secondary | ICD-10-CM

## 2014-10-11 ENCOUNTER — Other Ambulatory Visit: Payer: Self-pay

## 2016-11-15 ENCOUNTER — Emergency Department (HOSPITAL_BASED_OUTPATIENT_CLINIC_OR_DEPARTMENT_OTHER)
Admission: EM | Admit: 2016-11-15 | Discharge: 2016-11-15 | Disposition: A | Discharge status: Home / Self Care | Payer: Medicare HMO | Attending: Emergency Medicine | Admitting: Emergency Medicine

## 2016-11-15 ENCOUNTER — Emergency Department (HOSPITAL_BASED_OUTPATIENT_CLINIC_OR_DEPARTMENT_OTHER): Payer: Medicare HMO

## 2016-11-15 ENCOUNTER — Encounter (HOSPITAL_BASED_OUTPATIENT_CLINIC_OR_DEPARTMENT_OTHER): Payer: Self-pay | Admitting: Emergency Medicine

## 2016-11-15 DIAGNOSIS — J029 Acute pharyngitis, unspecified: Secondary | ICD-10-CM | POA: Diagnosis present

## 2016-11-15 DIAGNOSIS — Z79899 Other long term (current) drug therapy: Secondary | ICD-10-CM | POA: Diagnosis not present

## 2016-11-15 DIAGNOSIS — R0602 Shortness of breath: Secondary | ICD-10-CM | POA: Diagnosis not present

## 2016-11-15 DIAGNOSIS — J449 Chronic obstructive pulmonary disease, unspecified: Secondary | ICD-10-CM | POA: Diagnosis not present

## 2016-11-15 DIAGNOSIS — Z950 Presence of cardiac pacemaker: Secondary | ICD-10-CM | POA: Insufficient documentation

## 2016-11-15 DIAGNOSIS — I509 Heart failure, unspecified: Secondary | ICD-10-CM

## 2016-11-15 DIAGNOSIS — Z7901 Long term (current) use of anticoagulants: Secondary | ICD-10-CM | POA: Diagnosis not present

## 2016-11-15 DIAGNOSIS — I110 Hypertensive heart disease with heart failure: Secondary | ICD-10-CM | POA: Diagnosis not present

## 2016-11-15 DIAGNOSIS — Z91040 Latex allergy status: Secondary | ICD-10-CM | POA: Insufficient documentation

## 2016-11-15 LAB — BASIC METABOLIC PANEL
ANION GAP: 6 (ref 5–15)
BUN: 19 mg/dL (ref 6–20)
CHLORIDE: 103 mmol/L (ref 101–111)
CO2: 28 mmol/L (ref 22–32)
Calcium: 9.1 mg/dL (ref 8.9–10.3)
Creatinine, Ser: 1.08 mg/dL — ABNORMAL HIGH (ref 0.44–1.00)
GFR calc non Af Amer: 45 mL/min — ABNORMAL LOW (ref >60)
GFR, EST AFRICAN AMERICAN: 52 mL/min — AB (ref >60)
Glucose, Bld: 98 mg/dL (ref 65–99)
POTASSIUM: 3.8 mmol/L (ref 3.5–5.1)
Sodium: 137 mmol/L (ref 135–145)

## 2016-11-15 LAB — CBC WITH DIFFERENTIAL/PLATELET
BASOS ABS: 0 10*3/uL (ref 0.0–0.1)
BASOS PCT: 1 %
Eosinophils Absolute: 0.2 10*3/uL (ref 0.0–0.7)
Eosinophils Relative: 4 %
HEMATOCRIT: 34 % — AB (ref 36.0–46.0)
HEMOGLOBIN: 11.4 g/dL — AB (ref 12.0–15.0)
LYMPHS PCT: 34 %
Lymphs Abs: 2 10*3/uL (ref 0.7–4.0)
MCH: 31.6 pg (ref 26.0–34.0)
MCHC: 33.5 g/dL (ref 30.0–36.0)
MCV: 94.2 fL (ref 78.0–100.0)
Monocytes Absolute: 0.7 10*3/uL (ref 0.1–1.0)
Monocytes Relative: 13 %
NEUTROS ABS: 2.8 10*3/uL (ref 1.7–7.7)
NEUTROS PCT: 48 %
Platelets: 201 10*3/uL (ref 150–400)
RBC: 3.61 MIL/uL — ABNORMAL LOW (ref 3.87–5.11)
RDW: 13.8 % (ref 11.5–15.5)
WBC: 5.8 10*3/uL (ref 4.0–10.5)

## 2016-11-15 LAB — BRAIN NATRIURETIC PEPTIDE: B NATRIURETIC PEPTIDE 5: 330.5 pg/mL — AB (ref 0.0–100.0)

## 2016-11-15 LAB — TROPONIN I

## 2016-11-15 LAB — PROTIME-INR
INR: 1.06
Prothrombin Time: 13.7 seconds (ref 11.4–15.2)

## 2016-11-15 MED ORDER — FUROSEMIDE 10 MG/ML IJ SOLN
20.0000 mg | Freq: Once | INTRAMUSCULAR | Status: AC
  Administered 2016-11-15: 20 mg via INTRAVENOUS
  Filled 2016-11-15: qty 2

## 2016-11-15 NOTE — ED Provider Notes (Signed)
[facility] Provider Note   CSN: [id number] Arrival date & time: 11/15/16  1226     History   Chief Complaint Chief Complaint  Patient presents with  . Sore Throat    HPI Kara Sanders is a 83 y.o. female.  The history is provided by the patient. No language interpreter was used.  Sore Throat    Kara Sanders is a 83 y.o. female who presents to the Emergency Department complaining of sob.  She reports several months of shortness of breath that has been worsening over the last few weeks. She reports a wheezing sensation whenever she lays flat at night to sleep. She states that she needs to lay flat in order to breathe due to her hernia. She feels like her breathing is labored at night and she is wheezy. Her breathing noises are keeping her husband up at night. She reports chronic lower extremity edema for the last year. No sore throat, difficulty swallowing, chest pain, fevers, diaphoresis. Symptoms are moderate and worsening. She also endorses mild dyspnea on exertion. Past Medical History:  Diagnosis Date  . COPD, moderate (HCC)   . GERD (gastroesophageal reflux disease)   . Hiatal hernia   . HTN (hypertension)   . Mobitz type 2 second degree AV block     Patient Active Problem List   Diagnosis Date Noted  . Asthmatic bronchitis 08/21/2010  . SLEEP APNEA, OBSTRUCTIVE 03/09/2010  . BRADYCARDIA 10/22/2008  . HYPERTENSION 10/21/2008  . HIATAL HERNIA, HX OF 10/21/2008  . G E R D 02/04/2008    Past Surgical History:  Procedure Laterality Date  . COLONOSCOPY  09/22/02  . ESOPHAGOGASTRODUODENOSCOPY  12/20/98  . KNEE SURGERY     bilateral  . PACEMAKER PLACEMENT      OB History    No data available       Home Medications    Prior to Admission medications   Medication Sig Start Date End Date Taking? Authorizing Provider  carbidopa-levodopa-entacapone (STALEVO) 25-100-200 MG tablet Take 1 tablet by mouth 3 (three) times daily.   Yes [provider]  carvedilol (COREG) 25 MG tablet Take 25 mg by mouth 2 (two) times daily with a meal.   Yes [provider]  diclofenac (VOLTAREN) 50 MG EC tablet Take 50 mg by mouth 2 (two) times daily.   Yes [provider]  furosemide (LASIX) 20 MG tablet Take 20 mg by mouth.   Yes [provider]  levocetirizine (XYZAL) 5 MG tablet Take 5 mg by mouth every evening.   Yes [provider]  nefazodone (SERZONE) 150 MG tablet Take 150 mg by mouth 2 (two) times daily.   Yes [provider]  pantoprazole (PROTONIX) 40 MG tablet Take 40 mg by mouth daily.   Yes [provider]  albuterol (VENTOLIN HFA) 108 (90 BASE) MCG/ACT inhaler Inhale 2 puffs into the lungs every 6 (six) hours as needed. 10/18/10   Storm Frisk, MD  ALPRAZolam Prudy Feeler) 0.5 MG tablet 1/2 tab by mouth at bedtime 07/29/10   [provider]  amLODipine (NORVASC) 5 MG tablet Take 5 mg by mouth daily.      [provider]  cloNIDine (CATAPRES) 0.2 MG tablet Take 1 tablet by mouth Three times a day. 07/03/10   [provider]  dextromethorphan-guaiFENesin (MUCINEX DM) 30-600 MG per 12 hr tablet Take 1 tablet by mouth every 12 (twelve) hours.      [provider]  hydrochlorothiazide 25  MG tablet Take 25 mg by mouth daily.      [provider]  losartan (COZAAR) 100 MG tablet Take 100 mg by mouth daily.      [provider]  metoCLOPramide (REGLAN) 5 MG tablet Take 5 mg by mouth 2 (two) times daily.  07/21/10   [provider]  metoprolol (TOPROL-XL) 50 MG 24 hr tablet Take 25 mg by mouth daily.      [provider]  mometasone-formoterol (DULERA) 100-5 MCG/ACT AERO Inhale 2 puffs into the lungs 2 (two) times daily. 11/14/10   Storm Frisk, MD  NEXIUM 40 MG capsule Take 1 capsule by mouth daily before breakfast. 07/07/10   [provider]  potassium chloride SA (K-DUR,KLOR-CON) 20 MEQ tablet Take 20 mEq by  mouth 2 (two) times daily.      [provider]  Psyllium (METAMUCIL) 30.9 % POWD 2 (two) times daily.      [provider]  warfarin (COUMADIN) 5 MG tablet Take 5 mg by mouth as directed.      [provider]    Family History History reviewed. No pertinent family history.  Social History Social History  Substance Use Topics  . Smoking status: Never Smoker  . Smokeless tobacco: Never Used  . Alcohol use No     Allergies   Ciprofloxacin; Codeine; Latex; Penicillins; Sulfonamide derivatives; and Tiotropium bromide monohydrate   Review of Systems Review of Systems  All other systems reviewed and are negative.    Physical Exam Updated Vital Signs BP (!) 174/87 (BP Location: Left Arm)   Pulse 77   Temp 98.5 F (36.9 C) (Oral)   Resp (!) 22   Ht  (1.6 m)   Wt 83.9 kg (185 lb)   SpO2 97%   BMI 32.77 kg/m   Physical Exam  Constitutional: She is oriented to person, place, and time. She appears well-developed and well-nourished.  HENT:  Head: Normocephalic and atraumatic.  Mouth/Throat: Oropharynx is clear and moist.  Neck: Neck supple. JVD present.  Cardiovascular: Normal rate and regular rhythm.   No murmur heard. Pulmonary/Chest: Effort normal. No respiratory distress.  Diffuse end expiratory wheezes  Abdominal: Soft. There is no tenderness. There is no rebound and no guarding.  Musculoskeletal: She exhibits no tenderness.  2+ pitting edema to bilateral lower extremities  Neurological: She is alert and oriented to person, place, and time.  Skin: Skin is warm and dry.  Psychiatric: She has a normal mood and affect. Her behavior is normal.  Nursing note and vitals reviewed.    ED Treatments / Results  Labs (all labs ordered are listed, but only abnormal results are displayed) Labs Reviewed  BRAIN NATRIURETIC PEPTIDE - Abnormal; Notable for the following:       Result Value   B Natriuretic Peptide 330.5 (*)    All other  components within normal limits  CBC WITH DIFFERENTIAL/PLATELET - Abnormal; Notable for the following:    RBC 3.61 (*)    Hemoglobin 11.4 (*)    HCT 34.0 (*)    All other components within normal limits  BASIC METABOLIC PANEL - Abnormal; Notable for the following:    Creatinine, Ser 1.08 (*)    GFR calc non Af Amer 45 (*)    GFR calc Af Amer 52 (*)    All other components within normal limits  TROPONIN I  PROTIME-INR    EKG  EKG Interpretation  Date/Time:  Thursday November 15 2016 13:15:57 EDT  Ventricular Rate:  75 PR Interval:    QRS Duration: 156 QT Interval:  453 QTC Calculation: 506 R Axis:   -69 Text Interpretation:  Atrial-ventricular dual-paced rhythm No further analysis attempted due to paced rhythm Baseline wander in lead(s) I III aVL aVF V2 V5 V6 Confirmed by Tilden Fossa [phone]) on 11/15/2016 1:19:46 PM       Radiology Dg Neck Soft Tissue  Result Date: 11/15/2016 CLINICAL DATA:  Sore throat and shortness of breath since yesterday. EXAM: NECK SOFT TISSUES - 1+ VIEW COMPARISON:  None. FINDINGS: Pharynx is normal. Epiglottis and subglottic airway are within normal. Prevertebral soft tissues are normal. There is moderate spondylosis of the cervical spine with multilevel disc space narrowing most prominent at the C5-6 and C6-7 levels. Moderate uncovertebral joint spurring. IMPRESSION: Normal neck soft tissues. Moderate degenerative change of the spine with multilevel disc disease. Electronically Signed   By: Elberta Fortis M.D.   On: 11/15/2016 13:41   Dg Chest 2 View  Result Date: 11/15/2016 CLINICAL DATA:  Sore throat which shortness-of-breath. EXAM: CHEST  2 VIEW COMPARISON:  07/18/2016 FINDINGS: Left-sided pacemaker unchanged. Lungs are adequately inflated without focal consolidation or effusion. Cardiomediastinal silhouette and remainder the exam is unchanged. IMPRESSION: No active cardiopulmonary disease. Electronically Signed   By: Elberta Fortis M.D.   On:  11/15/2016 13:40    Procedures Procedures (including critical care time)  Medications Ordered in ED Medications  furosemide (LASIX) injection 20 mg (not administered)     Initial Impression / Assessment and Plan / ED Course  I have reviewed the triage vital signs and the nursing notes.  Pertinent labs & imaging results that were available during my care of the patient were reviewed by me and considered in my medical decision making (see chart for details).     Patient here with orthopnea and dyspnea on exertion. Symptoms are mild to moderate and progressive over the last several months. Examination is concerning for CHF exacerbation. She has no pulmonary edema on lung examination or chest x-ray. Presentation is not consistent with ACS, PE.  Providing additional dose of Lasix in the emergency department and will increase her home dose of Lasix. Discussed importance of very close PCP and cardiology follow-up for recheck. Return precautions discussed.  Final Clinical Impressions(s) / ED Diagnoses   Final diagnoses:  Shortness of breath  Chronic congestive heart failure, unspecified heart failure type [facility])    New Prescriptions New Prescriptions   No medications on file     Tilden Fossa, MD 11/15/16 1609

## 2016-11-15 NOTE — ED Triage Notes (Signed)
Patient states that she was seen and treated at her MD yesterday for a sore throat and when she lies down it takes her breath away and makes it short and was given a "shot" - reports that she did not take the shot because "theys know that I am allergic to a whole lot of medications" - patient was then given an RX for doxycycline. Reports that she tried to take that "pill and I's passed out and it almost killed me last night, I aint taking it anymore" - Pt. States that her dr called her today and told her to come here and have an x-ray. She can not recall of what or why.

## 2016-11-15 NOTE — ED Notes (Signed)
Patient informed on discharge instructions. Patient denies further requests.

## 2016-11-15 NOTE — Discharge Instructions (Signed)
Take two tablets of your lasix (furosemide) in the morning and one in the evening for the next three days (Friday, Saturday, and Sunday).  On Monday take your normal dose of lasix.  Get rechecked immediately if you develop any new or worrisome symptoms.

## 2017-05-01 ENCOUNTER — Other Ambulatory Visit: Payer: Self-pay

## 2017-05-01 ENCOUNTER — Emergency Department (HOSPITAL_BASED_OUTPATIENT_CLINIC_OR_DEPARTMENT_OTHER)
Admission: EM | Admit: 2017-05-01 | Discharge: 2017-05-01 | Disposition: A | Discharge status: Home / Self Care | Payer: Medicare HMO | Attending: Emergency Medicine | Admitting: Emergency Medicine

## 2017-05-01 ENCOUNTER — Encounter (HOSPITAL_BASED_OUTPATIENT_CLINIC_OR_DEPARTMENT_OTHER): Payer: Self-pay | Admitting: *Deleted

## 2017-05-01 ENCOUNTER — Emergency Department (HOSPITAL_BASED_OUTPATIENT_CLINIC_OR_DEPARTMENT_OTHER): Payer: Medicare HMO

## 2017-05-01 DIAGNOSIS — R600 Localized edema: Secondary | ICD-10-CM | POA: Insufficient documentation

## 2017-05-01 DIAGNOSIS — M79661 Pain in right lower leg: Secondary | ICD-10-CM | POA: Diagnosis not present

## 2017-05-01 DIAGNOSIS — J449 Chronic obstructive pulmonary disease, unspecified: Secondary | ICD-10-CM | POA: Insufficient documentation

## 2017-05-01 DIAGNOSIS — Z79899 Other long term (current) drug therapy: Secondary | ICD-10-CM | POA: Diagnosis not present

## 2017-05-01 DIAGNOSIS — M79662 Pain in left lower leg: Secondary | ICD-10-CM | POA: Insufficient documentation

## 2017-05-01 DIAGNOSIS — Z91040 Latex allergy status: Secondary | ICD-10-CM | POA: Insufficient documentation

## 2017-05-01 DIAGNOSIS — M7989 Other specified soft tissue disorders: Secondary | ICD-10-CM

## 2017-05-01 DIAGNOSIS — Z7901 Long term (current) use of anticoagulants: Secondary | ICD-10-CM | POA: Diagnosis not present

## 2017-05-01 DIAGNOSIS — Z950 Presence of cardiac pacemaker: Secondary | ICD-10-CM | POA: Insufficient documentation

## 2017-05-01 DIAGNOSIS — I10 Essential (primary) hypertension: Secondary | ICD-10-CM | POA: Insufficient documentation

## 2017-05-01 LAB — COMPREHENSIVE METABOLIC PANEL
ALK PHOS: 48 U/L (ref 38–126)
ALT: 9 U/L — AB (ref 14–54)
AST: 20 U/L (ref 15–41)
Albumin: 3.7 g/dL (ref 3.5–5.0)
Anion gap: 9 (ref 5–15)
BILIRUBIN TOTAL: 0.5 mg/dL (ref 0.3–1.2)
BUN: 13 mg/dL (ref 6–20)
CO2: 24 mmol/L (ref 22–32)
CREATININE: 1.03 mg/dL — AB (ref 0.44–1.00)
Calcium: 8.4 mg/dL — ABNORMAL LOW (ref 8.9–10.3)
Chloride: 103 mmol/L (ref 101–111)
GFR calc Af Amer: 55 mL/min — ABNORMAL LOW (ref >60)
GFR, EST NON AFRICAN AMERICAN: 48 mL/min — AB (ref >60)
GLUCOSE: 101 mg/dL — AB (ref 65–99)
Potassium: 3.6 mmol/L (ref 3.5–5.1)
Sodium: 136 mmol/L (ref 135–145)
TOTAL PROTEIN: 7 g/dL (ref 6.5–8.1)

## 2017-05-01 LAB — CBC WITH DIFFERENTIAL/PLATELET
Basophils Absolute: 0 10*3/uL (ref 0.0–0.1)
Basophils Relative: 1 %
Eosinophils Absolute: 0.2 10*3/uL (ref 0.0–0.7)
Eosinophils Relative: 5 %
HEMATOCRIT: 33.9 % — AB (ref 36.0–46.0)
HEMOGLOBIN: 11.3 g/dL — AB (ref 12.0–15.0)
LYMPHS PCT: 30 %
Lymphs Abs: 1.5 10*3/uL (ref 0.7–4.0)
MCH: 32 pg (ref 26.0–34.0)
MCHC: 33.3 g/dL (ref 30.0–36.0)
MCV: 96 fL (ref 78.0–100.0)
MONO ABS: 0.6 10*3/uL (ref 0.1–1.0)
Monocytes Relative: 13 %
NEUTROS ABS: 2.7 10*3/uL (ref 1.7–7.7)
NEUTROS PCT: 51 %
Platelets: 213 10*3/uL (ref 150–400)
RBC: 3.53 MIL/uL — AB (ref 3.87–5.11)
RDW: 13.4 % (ref 11.5–15.5)
WBC: 5.1 10*3/uL (ref 4.0–10.5)

## 2017-05-01 NOTE — ED Provider Notes (Signed)
[facility] EMERGENCY DEPARTMENT Provider Note   CSN: [id number] Arrival date & time: 05/01/17  1505     History   Chief Complaint Chief Complaint  Patient presents with  . Leg Swelling    HPI Kara Sanders is a 84 y.o. female.  The history is provided by the patient and medical records. No language interpreter was used.   Kara Sanders is a 84 y.o. female  with a PMH of COPD, GERD, HTN, pacemaker in place who presents to the Emergency Department complaining of lower extremity swelling. She reports intermittent mild swelling for several months, but over the last week, swelling has progressively worsened and she has been experiencing pain to LE's as well. She was seen by PCP last week and told to use compression stockings. She bought them and tried to use them, but could not get the stockings above her ankles.  Her pain was even worse this morning and she felt as if both of her "legs were about to bust open".  She called her primary care doctor who could not get her an appointment today.  I spoke with her primary care office to states they told her she could wait for an appointment later in the week, go to an urgent care or come to ER.  Denies any chest pain or shortness of breath.  No fever or chills recently.  No open wounds or any redness to the lower extremities.  She is on 20 mg of Lasix daily and has been compliant with this medication.  Not on any blood thinners currently.  Past Medical History:  Diagnosis Date  . COPD, moderate (HCC)   . GERD (gastroesophageal reflux disease)   . Hiatal hernia   . HTN (hypertension)   . Mobitz type 2 second degree AV block     Patient Active Problem List   Diagnosis Date Noted  . Asthmatic bronchitis 08/21/2010  . SLEEP APNEA, OBSTRUCTIVE 03/09/2010  . BRADYCARDIA 10/22/2008  . HYPERTENSION 10/21/2008  . HIATAL HERNIA, HX OF 10/21/2008  . G E R D 02/04/2008    Past Surgical History:  Procedure Laterality Date  .  COLONOSCOPY  09/22/02  . ESOPHAGOGASTRODUODENOSCOPY  12/20/98  . KNEE SURGERY     bilateral  . PACEMAKER PLACEMENT      OB History    No data available       Home Medications    Prior to Admission medications   Medication Sig Start Date End Date Taking? Authorizing Provider  albuterol (VENTOLIN HFA) 108 (90 BASE) MCG/ACT inhaler Inhale 2 puffs into the lungs every 6 (six) hours as needed. 10/18/10   Storm Frisk, MD  ALPRAZolam Prudy Feeler) 0.5 MG tablet 1/2 tab by mouth at bedtime 07/29/10   [provider]  amLODipine (NORVASC) 5 MG tablet Take 5 mg by mouth daily.      [provider]  carbidopa-levodopa-entacapone (STALEVO) 25-100-200 MG tablet Take 1 tablet by mouth 3 (three) times daily.    [provider]  carvedilol (COREG) 25 MG tablet Take 25 mg by mouth 2 (two) times daily with a meal.    [provider]  cloNIDine (CATAPRES) 0.2 MG tablet Take 1 tablet by mouth Three times a day. 07/03/10   [provider]  dextromethorphan-guaiFENesin (MUCINEX DM) 30-600 MG per 12 hr tablet Take 1 tablet by mouth every 12 (twelve) hours.      [provider]  diclofenac (VOLTAREN) 50 MG EC tablet Take 50 mg by  mouth 2 (two) times daily.    [provider]  furosemide (LASIX) 20 MG tablet Take 20 mg by mouth.    [provider]  hydrochlorothiazide 25 MG tablet Take 25 mg by mouth daily.      [provider]  levocetirizine (XYZAL) 5 MG tablet Take 5 mg by mouth every evening.    [provider]  losartan (COZAAR) 100 MG tablet Take 100 mg by mouth daily.      [provider]  metoCLOPramide (REGLAN) 5 MG tablet Take 5 mg by mouth 2 (two) times daily.  07/21/10   [provider]  metoprolol (TOPROL-XL) 50 MG 24 hr tablet Take 25 mg by mouth daily.      [provider]  mometasone-formoterol (DULERA) 100-5 MCG/ACT AERO Inhale 2 puffs into the lungs 2 (two) times daily. 11/14/10    Storm FriskWright, Patrick E, MD  nefazodone (SERZONE) 150 MG tablet Take 150 mg by mouth 2 (two) times daily.    [provider]  NEXIUM 40 MG capsule Take 1 capsule by mouth daily before breakfast. 07/07/10   [provider]  pantoprazole (PROTONIX) 40 MG tablet Take 40 mg by mouth daily.    [provider]  potassium chloride SA (K-DUR,KLOR-CON) 20 MEQ tablet Take 20 mEq by mouth 2 (two) times daily.      [provider]  Psyllium (METAMUCIL) 30.9 % POWD 2 (two) times daily.      [provider]  warfarin (COUMADIN) 5 MG tablet Take 5 mg by mouth as directed.      [provider]    Family History No family history on file.  Social History Social History   Tobacco Use  . Smoking status: Never Smoker  . Smokeless tobacco: Never Used  Substance Use Topics  . Alcohol use: No  . Drug use: Not on file     Allergies   Ciprofloxacin; Codeine; Latex; Penicillins; Sulfonamide derivatives; and Tiotropium bromide monohydrate   Review of Systems Review of Systems  Cardiovascular: Positive for leg swelling. Negative for chest pain and palpitations.  Musculoskeletal: Positive for myalgias.  All other systems reviewed and are negative.    Physical Exam Updated Vital Signs BP (!) 150/63 (BP Location: Right Arm)   Pulse 74   Temp 97.9 F (36.6 C) (Oral)   Resp 18   Ht 5\' 3"  (1.6 m)   Wt 85.3 kg (188 lb)   SpO2 95%   BMI 33.30 kg/m   Physical Exam  Constitutional: She is oriented to person, place, and time. She appears well-developed and well-nourished. No distress.  HENT:  Head: Normocephalic and atraumatic.  Cardiovascular: Normal rate, regular rhythm and normal heart sounds.  No murmur heard. Pulmonary/Chest: Effort normal and breath sounds normal. No respiratory distress.  Abdominal: Soft. She exhibits no distension. There is no tenderness.  Musculoskeletal:  Pitting edema to bilateral lower extremities which are tender to  palpation. No warmth. Full ROM. Sensation intact. 2+ DP. All compartments soft.   Neurological: She is alert and oriented to person, place, and time.  Skin: Skin is warm and dry.  Nursing note and vitals reviewed.    ED Treatments / Results  Labs (all labs ordered are listed, but only abnormal results are displayed) Labs Reviewed  CBC WITH DIFFERENTIAL/PLATELET - Abnormal; Notable for the following components:      Result Value   RBC 3.53 (*)    Hemoglobin 11.3 (*)    HCT 33.9 (*)  All other components within normal limits  COMPREHENSIVE METABOLIC PANEL - Abnormal; Notable for the following components:   Glucose, Bld 101 (*)    Creatinine, Ser 1.03 (*)    Calcium 8.4 (*)    ALT 9 (*)    GFR calc non Af Amer 48 (*)    GFR calc Af Amer 55 (*)    All other components within normal limits    EKG  EKG Interpretation None       Radiology Dg Chest 2 View  Result Date: 05/01/2017 CLINICAL DATA:  Bilateral lower extremity swelling. EXAM: CHEST - 2 VIEW COMPARISON:  Radiographs of November 15, 2016. FINDINGS: The heart size and mediastinal contours are within normal limits. Both lungs are clear. No pneumothorax or pleural effusion is noted. Left-sided pacemaker is unchanged in position. The visualized skeletal structures are unremarkable. IMPRESSION: No active cardiopulmonary disease. Electronically Signed   By: Lupita Raider, M.D.   On: 05/01/2017 16:53   US Venous Img Lower Bilateral  Result Date: 05/01/2017 CLINICAL DATA:  Bilateral lower extremity pain and edema for the past year, worse during the past 2 weeks. Evaluate for DVT. EXAM: BILATERAL LOWER EXTREMITY VENOUS DOPPLER ULTRASOUND TECHNIQUE: Gray-scale sonography with graded compression, as well as color Doppler and duplex ultrasound were performed to evaluate the lower extremity deep venous systems from the level of the common femoral vein and including the common femoral, femoral, profunda femoral, popliteal and calf  veins including the posterior tibial, peroneal and gastrocnemius veins when visible. The superficial great saphenous vein was also interrogated. Spectral Doppler was utilized to evaluate flow at rest and with distal augmentation maneuvers in the common femoral, femoral and popliteal veins. COMPARISON:  None. FINDINGS: RIGHT LOWER EXTREMITY Common Femoral Vein: No evidence of thrombus. Normal compressibility, respiratory phasicity and response to augmentation. Saphenofemoral Junction: No evidence of thrombus. Normal compressibility and flow on color Doppler imaging. Profunda Femoral Vein: No evidence of thrombus. Normal compressibility and flow on color Doppler imaging. Femoral Vein: No evidence of thrombus. Normal compressibility, respiratory phasicity and response to augmentation. Popliteal Vein: No evidence of thrombus. Normal compressibility, respiratory phasicity and response to augmentation. Calf Veins: No evidence of thrombus. Normal compressibility and flow on color Doppler imaging. Superficial Great Saphenous Vein: No evidence of thrombus. Normal compressibility. Venous Reflux:  None. Other Findings: Subcutaneous edema is noted at the level the right calf and ankle. LEFT LOWER EXTREMITY Common Femoral Vein: No evidence of thrombus. Normal compressibility, respiratory phasicity and response to augmentation. Saphenofemoral Junction: No evidence of thrombus. Normal compressibility and flow on color Doppler imaging. Profunda Femoral Vein: No evidence of thrombus. Normal compressibility and flow on color Doppler imaging. Femoral Vein: No evidence of thrombus. Normal compressibility, respiratory phasicity and response to augmentation. Popliteal Vein: No evidence of thrombus. Normal compressibility, respiratory phasicity and response to augmentation. Calf Veins: No evidence of thrombus. Normal compressibility and flow on color Doppler imaging. Superficial Great Saphenous Vein: No evidence of thrombus. Normal  compressibility. Venous Reflux:  None. Other Findings: Minimal subcutaneous edema is noted at the level the left calf and ankle. IMPRESSION: No evidence of DVT within either lower extremity. Electronically Signed   By: Simonne Come M.D.   On: 05/01/2017 17:31    Procedures Procedures (including critical care time)  Medications Ordered in ED Medications - No data to display   Initial Impression / Assessment and Plan / ED Course  I have reviewed the triage vital signs and the nursing notes.  Pertinent labs &  imaging results that were available during my care of the patient were reviewed by me and considered in my medical decision making (see chart for details).    FILICIA SCOGIN is a 84 y.o. female who presents to ED for bilateral lower extremity swelling. This has been present for several months, but acutely worsened over the last week. Saw PCP when symptoms began to worsen. She was instructed to use compression stockings, but unfortunately, could not get stockings over ankles, therefore has not been using them. She has been compliant with her 20mg  daily lasix. On exam, patient with bilateral lower extremity edema which is tender to palpation. Exam not concerning for infectious etiology. Labs reviewed and reassuring. CXR negative with no signs of vascular congestion. LE vascular ultrasound obtained and negative. Evaluation does not show pathology that would require ongoing emergent intervention or inpatient treatment. Will increase lasix from 20mg  daily to 40mg  daily for the next 3 days then return to usual dosing. Discussed importance of PCP follow up as well as compression hose. Reasons to return to ER discussed and all questions answered.   Patient seen by and discussed with Dr. Dalene Seltzer who agrees with treatment plan.   Final Clinical Impressions(s) / ED Diagnoses   Final diagnoses:  Leg swelling    ED Discharge Orders    None       Travaughn Vue, Chase Picket, PA-C 05/01/17 1832      Alvira Monday, MD 05/02/17 1248

## 2017-05-01 NOTE — ED Triage Notes (Signed)
Pt stated that she went to the her PCP and he told her to come to the ED b/c of Leg edema.

## 2017-05-01 NOTE — ED Notes (Signed)
Pt called out to go to the bathroom, went in to assist pt to the bathroom and pt stated she wanted to wait and didn't want to go

## 2017-05-01 NOTE — ED Notes (Signed)
Pt states that her legs have been swollen for  A while she went to dr on Kara Sanders of last week and was told to put elastic stockings on but she could not pass her ankles she states , she called the dr and he told her to come to ER. Pt has 4 plus pittng edema in lower legs

## 2017-05-01 NOTE — Discharge Instructions (Signed)
It was my pleasure taking care of you today!   Please call your primary care doctor today to schedule a follow up appointment for further discussion of your leg swelling.   Take 40 mg of your Lasix for the next 3 days, then go back to your usual 20mg  daily.   It is also important to try to find compression stockings or hose. You can try calling a medical supply store and see if they will measure you for compression hose to ensure they are the right size.   Return to ER for new or worsening symptoms, any additional concerns.

## 2017-05-08 ENCOUNTER — Other Ambulatory Visit: Payer: Self-pay

## 2017-05-08 ENCOUNTER — Emergency Department (HOSPITAL_BASED_OUTPATIENT_CLINIC_OR_DEPARTMENT_OTHER)
Admission: EM | Admit: 2017-05-08 | Discharge: 2017-05-08 | Disposition: A | Discharge status: Home / Self Care | Payer: Medicare HMO | Attending: Emergency Medicine | Admitting: Emergency Medicine

## 2017-05-08 ENCOUNTER — Encounter (HOSPITAL_BASED_OUTPATIENT_CLINIC_OR_DEPARTMENT_OTHER): Payer: Self-pay

## 2017-05-08 DIAGNOSIS — I10 Essential (primary) hypertension: Secondary | ICD-10-CM | POA: Diagnosis not present

## 2017-05-08 DIAGNOSIS — Z79899 Other long term (current) drug therapy: Secondary | ICD-10-CM | POA: Insufficient documentation

## 2017-05-08 DIAGNOSIS — J449 Chronic obstructive pulmonary disease, unspecified: Secondary | ICD-10-CM | POA: Insufficient documentation

## 2017-05-08 DIAGNOSIS — T7840XA Allergy, unspecified, initial encounter: Secondary | ICD-10-CM | POA: Insufficient documentation

## 2017-05-08 DIAGNOSIS — Z91040 Latex allergy status: Secondary | ICD-10-CM | POA: Insufficient documentation

## 2017-05-08 DIAGNOSIS — Z950 Presence of cardiac pacemaker: Secondary | ICD-10-CM | POA: Insufficient documentation

## 2017-05-08 DIAGNOSIS — M79604 Pain in right leg: Secondary | ICD-10-CM | POA: Diagnosis present

## 2017-05-08 DIAGNOSIS — M79605 Pain in left leg: Secondary | ICD-10-CM | POA: Diagnosis not present

## 2017-05-08 DIAGNOSIS — Z7901 Long term (current) use of anticoagulants: Secondary | ICD-10-CM | POA: Insufficient documentation

## 2017-05-08 MED ORDER — DIPHENHYDRAMINE HCL 25 MG PO CAPS
50.0000 mg | ORAL_CAPSULE | Freq: Once | ORAL | Status: AC
  Administered 2017-05-08: 50 mg via ORAL
  Filled 2017-05-08: qty 2

## 2017-05-08 MED ORDER — DIPHENHYDRAMINE HCL 25 MG PO TABS: 25.0000 mg | ORAL_TABLET | Freq: Four times a day (QID) | ORAL | 0 refills | Status: AC | PRN

## 2017-05-08 NOTE — ED Provider Notes (Signed)
[facility] EMERGENCY DEPARTMENT Provider Note   CSN: [id number] Arrival date & time: 05/08/17  1228     History   Chief Complaint Chief Complaint  Patient presents with  . Foot Pain    HPI Kara Sanders is a 84 y.o. female.  HPI   84 year old female with history of COPD, hypertension, type II heart block with pace maker currently on warfarin presenting for evaluation of leg pain and swelling.  Patient has had bilateral lower extremity swelling for the past several months but has become progressively worse within the past several weeks.  She was instructed to use compression stockings but unfortunately cannot get the stocking over her ankles.  She has been compliant with her Lasix 20 mg daily.  She was seen in the ED on 3/06 for the same complaint.  At which time, her labs were reviewed and reassuring, chest x-ray without any signs of vascular congestion and lower extremity ultrasounds were obtained and negative.  Her Lasix was increased from 20 mg to 40 mg daily for the next 3 days.  Patient is here today complaining of itchiness to her lower extremities.  She mentioned she has bad arthritis involving both of her feet and knee.  She has been using over-the-counter medication and different types of cream to help with the pain.  States for the past 2 weeks her knees and feet has bothered her more than usual.  Now she is here complaining of burning and itching sensation to her lower extremities.  States that she is using some over-the-counter medication but it did not help, it makes it worse.  Itchiness is what bothers her the most.  She denies having fever, chest pain, trouble breathing, or numbness.  She is scheduled to follow-up with her PCP tomorrow.  Past Medical History:  Diagnosis Date  . COPD, moderate (HCC)   . GERD (gastroesophageal reflux disease)   . Hiatal hernia   . HTN (hypertension)   . Mobitz type 2 second degree AV block     Patient Active Problem List   Diagnosis Date Noted  . Asthmatic bronchitis 08/21/2010  . SLEEP APNEA, OBSTRUCTIVE 03/09/2010  . BRADYCARDIA 10/22/2008  . HYPERTENSION 10/21/2008  . HIATAL HERNIA, HX OF 10/21/2008  . G E R D 02/04/2008    Past Surgical History:  Procedure Laterality Date  . COLONOSCOPY  09/22/02  . ESOPHAGOGASTRODUODENOSCOPY  12/20/98  . KNEE SURGERY     bilateral  . PACEMAKER PLACEMENT      OB History    No data available       Home Medications    Prior to Admission medications   Medication Sig Start Date End Date Taking? Authorizing Provider  albuterol (VENTOLIN HFA) 108 (90 BASE) MCG/ACT inhaler Inhale 2 puffs into the lungs every 6 (six) hours as needed. 10/18/10   Storm Frisk, MD  ALPRAZolam Prudy Feeler) 0.5 MG tablet 1/2 tab by mouth at bedtime 07/29/10   [provider]  amLODipine (NORVASC) 5 MG tablet Take 5 mg by mouth daily.      [provider]  carbidopa-levodopa-entacapone (STALEVO) 25-100-200 MG tablet Take 1 tablet by mouth 3 (three) times daily.    [provider]  carvedilol (COREG) 25 MG tablet Take 25 mg by mouth 2 (two) times daily with a meal.    [provider]  cloNIDine (CATAPRES) 0.2 MG tablet Take 1 tablet by mouth Three times a day. 07/03/10   [provider]  dextromethorphan-guaiFENesin [facility] DM)  30-600 MG per 12 hr tablet Take 1 tablet by mouth every 12 (twelve) hours.      [provider]  diclofenac (VOLTAREN) 50 MG EC tablet Take 50 mg by mouth 2 (two) times daily.    [provider]  furosemide (LASIX) 20 MG tablet Take 20 mg by mouth.    [provider]  hydrochlorothiazide 25 MG tablet Take 25 mg by mouth daily.      [provider]  levocetirizine (XYZAL) 5 MG tablet Take 5 mg by mouth every evening.    [provider]  losartan (COZAAR) 100 MG tablet Take 100 mg by mouth daily.      [provider]  metoCLOPramide (REGLAN) 5 MG tablet Take 5 mg by mouth 2  (two) times daily.  07/21/10   [provider]  metoprolol (TOPROL-XL) 50 MG 24 hr tablet Take 25 mg by mouth daily.      [provider]  mometasone-formoterol (DULERA) 100-5 MCG/ACT AERO Inhale 2 puffs into the lungs 2 (two) times daily. 11/14/10   Storm Frisk, MD  nefazodone (SERZONE) 150 MG tablet Take 150 mg by mouth 2 (two) times daily.    [provider]  NEXIUM 40 MG capsule Take 1 capsule by mouth daily before breakfast. 07/07/10   [provider]  pantoprazole (PROTONIX) 40 MG tablet Take 40 mg by mouth daily.    [provider]  potassium chloride SA (K-DUR,KLOR-CON) 20 MEQ tablet Take 20 mEq by mouth 2 (two) times daily.      [provider]  Psyllium (METAMUCIL) 30.9 % POWD 2 (two) times daily.      [provider]  warfarin (COUMADIN) 5 MG tablet Take 5 mg by mouth as directed.      [provider]    Family History No family history on file.  Social History Social History   Tobacco Use  . Smoking status: Never Smoker  . Smokeless tobacco: Never Used  Substance Use Topics  . Alcohol use: No  . Drug use: Not on file     Allergies   Ciprofloxacin; Codeine; Latex; Penicillins; Sulfonamide derivatives; and Tiotropium bromide monohydrate   Review of Systems Review of Systems  All other systems reviewed and are negative.    Physical Exam Updated Vital Signs BP (!) 153/72 (BP Location: Right Arm)   Pulse 71   Temp 98.1 F (36.7 C) (Oral)   Resp 18   Ht 5\' 3"  (1.6 m)   Wt 81.6 kg (180 lb)   SpO2 97%   BMI 31.89 kg/m   Physical Exam  Constitutional: She appears well-developed and well-nourished. No distress.  HENT:  Head: Atraumatic.  Eyes: Conjunctivae are normal.  Neck: Neck supple.  Musculoskeletal:  Bilateral knees and feet are nontender to palpation.  Neurological: She is alert.  Skin: No rash noted.  Bilateral lower extremities: 1+ edema to bilateral lower extremities  extending towards the mid tib-fib region.  Mild skin erythema involving the anterior aspects of both lower extremities dorsalis pedis pulse palpable with brisk cap refill.  No deformity noted.  Psychiatric: She has a normal mood and affect.  Nursing note and vitals reviewed.    ED Treatments / Results  Labs (all labs ordered are listed, but only abnormal results are displayed) Labs Reviewed - No data to display  EKG  EKG Interpretation None       Radiology No results found.  Procedures Procedures (including critical care time)  Medications  Ordered in ED Medications - No data to display   Initial Impression / Assessment and Plan / ED Course  I have reviewed the triage vital signs and the nursing notes.  Pertinent labs & imaging results that were available during my care of the patient were reviewed by me and considered in my medical decision making (see chart for details).     BP (!) 153/72 (BP Location: Right Arm)   Pulse 71   Temp 98.1 F (36.7 C) (Oral)   Resp 18   Ht 5\' 3"  (1.6 m)   Wt 81.6 kg (180 lb)   SpO2 97%   BMI 31.89 kg/m    Final Clinical Impressions(s) / ED Diagnoses   Final diagnoses:  Allergic reaction, initial encounter    ED Discharge Orders        Ordered    diphenhydrAMINE (BENADRYL) 25 MG tablet  Every 6 hours PRN     05/08/17 1542     3:06 PM Patient complaining of itchiness to her lower extremities.  States she has arthritis involving her feet and knees.  I suspect her itchiness is likely a reaction to some of the over-the-counter treatment that she is currently using.  It does not appears to be infected.  No recent trauma to suggest acute fractures or dislocation of the bones.  She has had DVT study as well as other lab tests fairly recently without concerning feature.  At this time, my plan is to prescribe Benadryl to help with itchiness, and recommend patient to follow-up with her PCP tomorrow for further care.  Patient voiced  understanding of and agrees with plan.  Patient is neurovascular intact. Care discussed with Dr. Rubin PayorPickering.    Fayrene Helperran, Jesalyn Finazzo, PA-C 05/08/17 1545    Benjiman CorePickering, Nathan, MD 05/09/17 [phone]

## 2017-05-08 NOTE — Discharge Instructions (Signed)
Your itchiness and discomfort in your lower legs may be due to a reaction against skin treatment that you have been using.  Please discuss this further with your primary care provider tomorrow.  Take benadryl as needed for itchiness.  Avoid using cream at this time. Be aware that benadryl can cause drowsiness.

## 2017-05-08 NOTE — ED Triage Notes (Signed)
Pt c/o cont'd pain to both feet-presents to triage in w/c-NAD

## 2017-05-23 ENCOUNTER — Emergency Department (HOSPITAL_BASED_OUTPATIENT_CLINIC_OR_DEPARTMENT_OTHER): Payer: Medicare HMO

## 2017-05-23 ENCOUNTER — Emergency Department (HOSPITAL_BASED_OUTPATIENT_CLINIC_OR_DEPARTMENT_OTHER)
Admission: EM | Admit: 2017-05-23 | Discharge: 2017-05-23 | Disposition: A | Discharge status: Home / Self Care | Payer: Medicare HMO | Attending: Emergency Medicine | Admitting: Emergency Medicine

## 2017-05-23 ENCOUNTER — Other Ambulatory Visit: Payer: Self-pay

## 2017-05-23 ENCOUNTER — Encounter (HOSPITAL_BASED_OUTPATIENT_CLINIC_OR_DEPARTMENT_OTHER): Payer: Self-pay

## 2017-05-23 DIAGNOSIS — Z91040 Latex allergy status: Secondary | ICD-10-CM | POA: Diagnosis not present

## 2017-05-23 DIAGNOSIS — R600 Localized edema: Secondary | ICD-10-CM

## 2017-05-23 DIAGNOSIS — J449 Chronic obstructive pulmonary disease, unspecified: Secondary | ICD-10-CM | POA: Diagnosis not present

## 2017-05-23 DIAGNOSIS — Z950 Presence of cardiac pacemaker: Secondary | ICD-10-CM | POA: Diagnosis not present

## 2017-05-23 DIAGNOSIS — I10 Essential (primary) hypertension: Secondary | ICD-10-CM | POA: Diagnosis not present

## 2017-05-23 DIAGNOSIS — Z7901 Long term (current) use of anticoagulants: Secondary | ICD-10-CM | POA: Insufficient documentation

## 2017-05-23 DIAGNOSIS — R2243 Localized swelling, mass and lump, lower limb, bilateral: Secondary | ICD-10-CM | POA: Diagnosis not present

## 2017-05-23 DIAGNOSIS — R0602 Shortness of breath: Secondary | ICD-10-CM | POA: Diagnosis not present

## 2017-05-23 DIAGNOSIS — Z79899 Other long term (current) drug therapy: Secondary | ICD-10-CM | POA: Diagnosis not present

## 2017-05-23 LAB — CBC WITH DIFFERENTIAL/PLATELET
BASOS PCT: 0 %
Basophils Absolute: 0 10*3/uL (ref 0.0–0.1)
EOS ABS: 0.2 10*3/uL (ref 0.0–0.7)
Eosinophils Relative: 4 %
HCT: 33.4 % — ABNORMAL LOW (ref 36.0–46.0)
HEMOGLOBIN: 11.1 g/dL — AB (ref 12.0–15.0)
Lymphocytes Relative: 30 %
Lymphs Abs: 1.4 10*3/uL (ref 0.7–4.0)
MCH: 31.9 pg (ref 26.0–34.0)
MCHC: 33.2 g/dL (ref 30.0–36.0)
MCV: 96 fL (ref 78.0–100.0)
Monocytes Absolute: 0.6 10*3/uL (ref 0.1–1.0)
Monocytes Relative: 12 %
NEUTROS PCT: 54 %
Neutro Abs: 2.6 10*3/uL (ref 1.7–7.7)
PLATELETS: 206 10*3/uL (ref 150–400)
RBC: 3.48 MIL/uL — AB (ref 3.87–5.11)
RDW: 13.4 % (ref 11.5–15.5)
WBC: 4.8 10*3/uL (ref 4.0–10.5)

## 2017-05-23 LAB — COMPREHENSIVE METABOLIC PANEL
ALBUMIN: 3.7 g/dL (ref 3.5–5.0)
ALK PHOS: 50 U/L (ref 38–126)
ALT: 6 U/L — AB (ref 14–54)
ANION GAP: 6 (ref 5–15)
AST: 21 U/L (ref 15–41)
BUN: 12 mg/dL (ref 6–20)
CALCIUM: 8.7 mg/dL — AB (ref 8.9–10.3)
CHLORIDE: 105 mmol/L (ref 101–111)
CO2: 24 mmol/L (ref 22–32)
Creatinine, Ser: 1.01 mg/dL — ABNORMAL HIGH (ref 0.44–1.00)
GFR calc Af Amer: 57 mL/min — ABNORMAL LOW (ref >60)
GFR calc non Af Amer: 49 mL/min — ABNORMAL LOW (ref >60)
GLUCOSE: 108 mg/dL — AB (ref 65–99)
Potassium: 3.5 mmol/L (ref 3.5–5.1)
SODIUM: 135 mmol/L (ref 135–145)
Total Bilirubin: 0.4 mg/dL (ref 0.3–1.2)
Total Protein: 7.3 g/dL (ref 6.5–8.1)

## 2017-05-23 LAB — TROPONIN I: Troponin I: <0.03 ng/mL (ref <0.03)

## 2017-05-23 LAB — PROTIME-INR
INR: 1.1
PROTHROMBIN TIME: 14.2 s (ref 11.4–15.2)

## 2017-05-23 LAB — BRAIN NATRIURETIC PEPTIDE: B Natriuretic Peptide: 191.7 pg/mL — ABNORMAL HIGH (ref 0.0–100.0)

## 2017-05-23 NOTE — Discharge Instructions (Addendum)
Today you have swelling in both of your lower legs from extra fluid.  Your kidneys were checked and they are working well.  Your electrolytes also look normal.  No signs of heart damage or congestive heart failure today.  We will have you increase your furosemide pill to 40 mg a day.  That will be 2 tablets at the same time daily. Also follow-up with your doctor at [facility] to see if you need to be taking all 5 of the blood pressure medications.  Also ask if he believes that the amlodipine could possibly be causing your swelling

## 2017-05-23 NOTE — ED Notes (Signed)
Patient transported to X-ray 

## 2017-05-23 NOTE — ED Triage Notes (Signed)
Pt c/o bilat leg swelling x "long time"-states her PCP advised her to come to ED

## 2017-05-23 NOTE — ED Notes (Signed)
ED Provider at bedside. 

## 2017-05-23 NOTE — ED Provider Notes (Addendum)
[facility] EMERGENCY DEPARTMENT Provider Note   CSN: [id number] Arrival date & time: 05/23/17  1300     History   Chief Complaint Chief Complaint  Patient presents with  . Leg Swelling    HPI Kara Sanders is a 84 y.o. female.  Patient is an 84 year old female with a history of hypertension, heart block status post pacemaker, COPD, chronic lower extremity swelling presenting today with worsening swelling in her bilateral lower extremities.  Patient states that she has had lower extremity swelling for a long time but it is worse in the last 2 weeks.  It is getting to the point where it is painful for her to walk and her legs feel heavy.  She denies any chest pain and does not have any resting dyspnea but feels slightly short of breath with walking.  She denies cough, congestion or fever.  She has had no vomiting or abdominal pain.  Patient states she was seen initially a little over 10 days ago and at that time was diagnosed with allergic reaction and given medicine which she states she could not take because it made her feel terrible.  She denies any other medication changes.  She has been on her blood pressure medications for some time.  She does take 20 mg of Lasix daily but has not increased.  She denies weight gain and has actually lost 2 pounds.  She denies any urinary complaints.  When she went to her doctor's they recommended she use compression hose which she tried but they had so much difficulty getting them off she is not put them back on  The history is provided by the patient.    Past Medical History:  Diagnosis Date  . COPD, moderate (HCC)   . GERD (gastroesophageal reflux disease)   . Hiatal hernia   . HTN (hypertension)   . Mobitz type 2 second degree AV block     Patient Active Problem List   Diagnosis Date Noted  . Asthmatic bronchitis 08/21/2010  . SLEEP APNEA, OBSTRUCTIVE 03/09/2010  . BRADYCARDIA 10/22/2008  . HYPERTENSION 10/21/2008  . HIATAL  HERNIA, HX OF 10/21/2008  . G E R D 02/04/2008    Past Surgical History:  Procedure Laterality Date  . COLONOSCOPY  09/22/02  . ESOPHAGOGASTRODUODENOSCOPY  12/20/98  . KNEE SURGERY     bilateral  . PACEMAKER PLACEMENT       OB History   None      Home Medications    Prior to Admission medications   Medication Sig Start Date End Date Taking? Authorizing Provider  albuterol (VENTOLIN HFA) 108 (90 BASE) MCG/ACT inhaler Inhale 2 puffs into the lungs every 6 (six) hours as needed. 10/18/10   Storm Frisk, MD  ALPRAZolam Prudy Feeler) 0.5 MG tablet 1/2 tab by mouth at bedtime 07/29/10   [provider]  amLODipine (NORVASC) 5 MG tablet Take 5 mg by mouth daily.      [provider]  carbidopa-levodopa-entacapone (STALEVO) 25-100-200 MG tablet Take 1 tablet by mouth 3 (three) times daily.    [provider]  carvedilol (COREG) 25 MG tablet Take 25 mg by mouth 2 (two) times daily with a meal.    [provider]  cloNIDine (CATAPRES) 0.2 MG tablet Take 1 tablet by mouth Three times a day. 07/03/10   [provider]  dextromethorphan-guaiFENesin (MUCINEX DM) 30-600 MG per 12 hr tablet Take 1 tablet by mouth every 12 (twelve) hours.  [provider]  diclofenac (VOLTAREN) 50 MG EC tablet Take 50 mg by mouth 2 (two) times daily.    [provider]  diphenhydrAMINE (BENADRYL) 25 MG tablet Take 1 tablet (25 mg total) by mouth every 6 (six) hours as needed for itching or allergies. 05/08/17   Fayrene Helper, PA-C  furosemide (LASIX) 20 MG tablet Take 20 mg by mouth.    [provider]  hydrochlorothiazide 25 MG tablet Take 25 mg by mouth daily.      [provider]  levocetirizine (XYZAL) 5 MG tablet Take 5 mg by mouth every evening.    [provider]  losartan (COZAAR) 100 MG tablet Take 100 mg by mouth daily.      [provider]  metoCLOPramide (REGLAN) 5 MG tablet Take 5 mg by mouth 2 (two) times  daily.  07/21/10   [provider]  metoprolol (TOPROL-XL) 50 MG 24 hr tablet Take 25 mg by mouth daily.      [provider]  mometasone-formoterol (DULERA) 100-5 MCG/ACT AERO Inhale 2 puffs into the lungs 2 (two) times daily. 11/14/10   Storm Frisk, MD  nefazodone (SERZONE) 150 MG tablet Take 150 mg by mouth 2 (two) times daily.    [provider]  NEXIUM 40 MG capsule Take 1 capsule by mouth daily before breakfast. 07/07/10   [provider]  pantoprazole (PROTONIX) 40 MG tablet Take 40 mg by mouth daily.    [provider]  potassium chloride SA (K-DUR,KLOR-CON) 20 MEQ tablet Take 20 mEq by mouth 2 (two) times daily.      [provider]  Psyllium (METAMUCIL) 30.9 % POWD 2 (two) times daily.      [provider]  warfarin (COUMADIN) 5 MG tablet Take 5 mg by mouth as directed.      [provider]    Family History No family history on file.  Social History Social History   Tobacco Use  . Smoking status: Never Smoker  . Smokeless tobacco: Never Used  Substance Use Topics  . Alcohol use: No  . Drug use: Not on file     Allergies   Ciprofloxacin; Codeine; Latex; Penicillins; Sulfonamide derivatives; and Tiotropium bromide monohydrate   Review of Systems Review of Systems  All other systems reviewed and are negative.    Physical Exam Updated Vital Signs BP 108/65 (BP Location: Right Arm)   Pulse 79   Temp 98 F (36.7 C) (Oral)   Resp 18   Ht 5\' 3"  (1.6 m)   Wt 82.6 kg (182 lb)   SpO2 97%   BMI 32.24 kg/m   Physical Exam  Constitutional: She is oriented to person, place, and time. She appears well-developed and well-nourished. No distress.  HENT:  Head: Normocephalic and atraumatic.  Mouth/Throat: Oropharynx is clear and moist.  Eyes: Pupils are equal, round, and reactive to light. Conjunctivae and EOM are normal.  Neck: Normal range of motion. Neck supple.  Cardiovascular: Normal rate,  regular rhythm and intact distal pulses.  No murmur heard. Pulmonary/Chest: Effort normal. No respiratory distress. She has no wheezes. She has no rhonchi. She has rales in the right lower field.  Pacemaker present in the left upper chest wall  Abdominal: Soft. She exhibits no distension. There is no tenderness. There is no rebound and no guarding.  Musculoskeletal: Normal range of motion. She exhibits edema. She exhibits no tenderness.  3-4+ pitting edema to the proximal tib-fib bilaterally doses intact in  the feet bilaterally.  Sensation in the feet are intact.  There is no significant erythema, warmth or drainage from the legs  Neurological: She is alert and oriented to person, place, and time.  Skin: Skin is warm and dry. No rash noted. No erythema.  Psychiatric: She has a normal mood and affect. Her behavior is normal.  Nursing note and vitals reviewed.    ED Treatments / Results  Labs (all labs ordered are listed, but only abnormal results are displayed) Labs Reviewed  CBC WITH DIFFERENTIAL/PLATELET - Abnormal; Notable for the following components:      Result Value   RBC 3.48 (*)    Hemoglobin 11.1 (*)    HCT 33.4 (*)    All other components within normal limits  COMPREHENSIVE METABOLIC PANEL - Abnormal; Notable for the following components:   Glucose, Bld 108 (*)    Creatinine, Ser 1.01 (*)    Calcium 8.7 (*)    ALT 6 (*)    GFR calc non Af Amer 49 (*)    GFR calc Af Amer 57 (*)    All other components within normal limits  BRAIN NATRIURETIC PEPTIDE - Abnormal; Notable for the following components:   B Natriuretic Peptide 191.7 (*)    All other components within normal limits  TROPONIN I  PROTIME-INR    EKG EKG Interpretation  Date/Time:  Thursday May 23 2017 14:23:34 EDT Ventricular Rate:  86 PR Interval:    QRS Duration: 155 QT Interval:  434 QTC Calculation: 520 R Axis:   -73 Text Interpretation:  Atrial-ventricular dual-paced rhythm No further analysis  attempted due to paced rhythm No significant change since last tracing Confirmed by Gwyneth Sproutlunkett, Jouri Threat ([id number]) on 05/23/2017 2:49:53 PM   Radiology Dg Chest 2 View  Result Date: 05/23/2017 CLINICAL DATA:  Short of breath bilateral leg swelling EXAM: CHEST - 2 VIEW COMPARISON:  05/01/2017 FINDINGS: Cardiac enlargement without heart failure or edema. No pleural effusion. Pulmonary hyperinflation without pneumonia. Dual lead pacemaker unchanged. IMPRESSION: No active cardiopulmonary disease. Electronically Signed   By: Marlan Palauharles  Clark M.D.   On: 05/23/2017 14:29    Procedures Procedures (including critical care time)  Medications Ordered in ED Medications - No data to display   Initial Impression / Assessment and Plan / ED Course  I have reviewed the triage vital signs and the nursing notes.  Pertinent labs & imaging results that were available during my care of the patient were reviewed by me and considered in my medical decision making (see chart for details).     Elderly female with multiple medical problems on multiple medications presenting with worsening leg swelling.  Patient was seen on the sixth and at that time had her Lasix increased from 20-40 mg for 3 days after having negative DVT studies and normal renal function.  She was then seen on the 13th of this month for itching of her lower extremities and at that time she was thought to have an allergic reaction.  At that time she also only had 1+ pitting edema.  Patient was given Benadryl which she did not take because it made her feel sick.  She is presenting today with worsening swelling.  May be slight shortness of breath with ambulating.  However patient is comfortable on exam able to lay flat and in no acute distress.  Patient does have significant swelling of her lower extremities with some minimal rales in the right lower lobe.  Patient states she has lost weight.  Will  rule out CHF at this time or worsening renal function.  May need  to increase her Lasix indefinitely.  Patient tried to wear compression stockings but was unable to get them off so has not continued wearing them.  There are no signs of cellulitis and low suspicion for DVT. Chest x-ray, EKG, CBC and BMP, INR are pending.  3:06 PM EKG is unchanged with a paced rhythm, chest x-ray is clear, CBC, BMP are without acute changes.  Patient's troponin is negative and BNP is improved from 300-190.  INR within normal limits but patient no longer takes Coumadin.  Patient takes 5 different blood pressure medications including Norvasc, Bindil, losartan, clonidine, and carvedilol.  Questionable whether 1 of these medications is causing her swelling.  Feel that it is reasonable to increase patient's Lasix to 40 mg daily and follow-up with her PCP on Monday or Tuesday for recheck.  Also encouraged elevating the legs.  Encourage patient to follow-up with her PCP to see if she needs to be on all of these medications and that may be some of them can be discontinued and might improve her leg swelling  Final Clinical Impressions(s) / ED Diagnoses   Final diagnoses:  Bilateral lower extremity edema    ED Discharge Orders    None       Gwyneth Sprout, MD 05/23/17 1610    Gwyneth Sprout, MD 05/23/17 1553

## 2017-06-04 ENCOUNTER — Encounter (HOSPITAL_BASED_OUTPATIENT_CLINIC_OR_DEPARTMENT_OTHER): Payer: Self-pay | Admitting: *Deleted

## 2017-06-04 ENCOUNTER — Other Ambulatory Visit: Payer: Self-pay

## 2017-06-04 ENCOUNTER — Emergency Department (HOSPITAL_BASED_OUTPATIENT_CLINIC_OR_DEPARTMENT_OTHER): Payer: Medicare HMO

## 2017-06-04 ENCOUNTER — Emergency Department (HOSPITAL_BASED_OUTPATIENT_CLINIC_OR_DEPARTMENT_OTHER)
Admission: EM | Admit: 2017-06-04 | Discharge: 2017-06-04 | Disposition: A | Discharge status: Home / Self Care | Payer: Medicare HMO | Attending: Physician Assistant | Admitting: Physician Assistant

## 2017-06-04 DIAGNOSIS — Z950 Presence of cardiac pacemaker: Secondary | ICD-10-CM | POA: Insufficient documentation

## 2017-06-04 DIAGNOSIS — Z91040 Latex allergy status: Secondary | ICD-10-CM | POA: Insufficient documentation

## 2017-06-04 DIAGNOSIS — Z79899 Other long term (current) drug therapy: Secondary | ICD-10-CM | POA: Diagnosis not present

## 2017-06-04 DIAGNOSIS — I10 Essential (primary) hypertension: Secondary | ICD-10-CM | POA: Insufficient documentation

## 2017-06-04 DIAGNOSIS — Z7901 Long term (current) use of anticoagulants: Secondary | ICD-10-CM | POA: Diagnosis not present

## 2017-06-04 DIAGNOSIS — R609 Edema, unspecified: Secondary | ICD-10-CM | POA: Diagnosis not present

## 2017-06-04 DIAGNOSIS — R2243 Localized swelling, mass and lump, lower limb, bilateral: Secondary | ICD-10-CM | POA: Diagnosis present

## 2017-06-04 DIAGNOSIS — J449 Chronic obstructive pulmonary disease, unspecified: Secondary | ICD-10-CM | POA: Insufficient documentation

## 2017-06-04 LAB — BASIC METABOLIC PANEL
Anion gap: 10 (ref 5–15)
BUN: 14 mg/dL (ref 6–20)
CHLORIDE: 100 mmol/L — AB (ref 101–111)
CO2: 25 mmol/L (ref 22–32)
CREATININE: 1.11 mg/dL — AB (ref 0.44–1.00)
Calcium: 8.8 mg/dL — ABNORMAL LOW (ref 8.9–10.3)
GFR calc non Af Amer: 44 mL/min — ABNORMAL LOW (ref >60)
GFR, EST AFRICAN AMERICAN: 51 mL/min — AB (ref >60)
Glucose, Bld: 106 mg/dL — ABNORMAL HIGH (ref 65–99)
Potassium: 3.9 mmol/L (ref 3.5–5.1)
SODIUM: 135 mmol/L (ref 135–145)

## 2017-06-04 LAB — URINALYSIS, ROUTINE W REFLEX MICROSCOPIC
BILIRUBIN URINE: NEGATIVE
Glucose, UA: NEGATIVE mg/dL
Hgb urine dipstick: NEGATIVE
KETONES UR: NEGATIVE mg/dL
Nitrite: NEGATIVE
PROTEIN: NEGATIVE mg/dL
SPECIFIC GRAVITY, URINE: 1.01 (ref 1.005–1.030)
pH: 6 (ref 5.0–8.0)

## 2017-06-04 LAB — CBC
HCT: 32.1 % — ABNORMAL LOW (ref 36.0–46.0)
Hemoglobin: 11.3 g/dL — ABNORMAL LOW (ref 12.0–15.0)
MCH: 33.5 pg (ref 26.0–34.0)
MCHC: 35.2 g/dL (ref 30.0–36.0)
MCV: 95.3 fL (ref 78.0–100.0)
PLATELETS: 225 10*3/uL (ref 150–400)
RBC: 3.37 MIL/uL — AB (ref 3.87–5.11)
RDW: 13.1 % (ref 11.5–15.5)
WBC: 5.7 10*3/uL (ref 4.0–10.5)

## 2017-06-04 LAB — URINALYSIS, MICROSCOPIC (REFLEX)

## 2017-06-04 LAB — BRAIN NATRIURETIC PEPTIDE: B NATRIURETIC PEPTIDE 5: 182.7 pg/mL — AB (ref 0.0–100.0)

## 2017-06-04 LAB — TROPONIN I

## 2017-06-04 MED ORDER — SODIUM CHLORIDE 0.9 % IV SOLN
1.0000 g | Freq: Once | INTRAVENOUS | Status: AC
  Administered 2017-06-04: 1 g via INTRAVENOUS
  Filled 2017-06-04: qty 10

## 2017-06-04 MED ORDER — CEPHALEXIN 500 MG PO CAPS: 500.0000 mg | ORAL_CAPSULE | Freq: Four times a day (QID) | ORAL | 0 refills | Status: DC

## 2017-06-04 MED ORDER — FUROSEMIDE 10 MG/ML IJ SOLN
20.0000 mg | Freq: Once | INTRAMUSCULAR | Status: AC
  Administered 2017-06-04: 20 mg via INTRAVENOUS
  Filled 2017-06-04: qty 2

## 2017-06-04 NOTE — ED Triage Notes (Signed)
Pt siad her PCP sent her down here for feet and leg swelling, she also stated that she has a bladder infection b/c it burns when she urinates.

## 2017-06-04 NOTE — ED Provider Notes (Signed)
[facility] EMERGENCY DEPARTMENT Provider Note   CSN: [id number] Arrival date & time: 06/04/17  1426     History   Chief Complaint Chief Complaint  Patient presents with  . Leg Swelling  . Cystitis    HPI Kara Sanders is a 84 y.o. female.  HPI   Patient is an 84 year old female past medical history significant for GERD, COPD, hypertension, and bilateral lower extremity swelling.  She has been to the emergency department on 6 March, 13 March, 28 March, and now April 9.  Every time she is concerned about her lower extremity swelling.  We have made several changes to her medication.  When asked about them today, she reports "I did not read any of the instructions".  "But I promised to next time".  Patient thinks that the extra Lasix that she took is called to have caused her to have a urinary tract infections.  We discussed that this is not how the normal course of urinary tract infection goes, however she repeated it several times in the room "I just know that those pills cause me to have infection".  Patient denies any shortness of breath.  Patient is supposed to be on 5-6 blood pressure medications.  It is unclear what she is actually taking.  She is been sent home every time with instructions follow-up with primary care physician.  She reports she's had trouble following up, and when she calls "he is always sick"  Called PCP.  Seems patient was last seen on on 3/14.    Past Medical History:  Diagnosis Date  . COPD, moderate (HCC)   . GERD (gastroesophageal reflux disease)   . Hiatal hernia   . HTN (hypertension)   . Mobitz type 2 second degree AV block     Patient Active Problem List   Diagnosis Date Noted  . Asthmatic bronchitis 08/21/2010  . SLEEP APNEA, OBSTRUCTIVE 03/09/2010  . BRADYCARDIA 10/22/2008  . HYPERTENSION 10/21/2008  . HIATAL HERNIA, HX OF 10/21/2008  . G E R D 02/04/2008    Past Surgical History:  Procedure Laterality Date  .  COLONOSCOPY  09/22/02  . ESOPHAGOGASTRODUODENOSCOPY  12/20/98  . KNEE SURGERY     bilateral  . PACEMAKER PLACEMENT       OB History   None      Home Medications    Prior to Admission medications   Medication Sig Start Date End Date Taking? Authorizing Provider  albuterol (VENTOLIN HFA) 108 (90 BASE) MCG/ACT inhaler Inhale 2 puffs into the lungs every 6 (six) hours as needed. 10/18/10   Storm Frisk, MD  ALPRAZolam Prudy Feeler) 0.5 MG tablet 1/2 tab by mouth at bedtime 07/29/10   [provider]  amLODipine (NORVASC) 5 MG tablet Take 5 mg by mouth daily.      [provider]  carbidopa-levodopa-entacapone (STALEVO) 25-100-200 MG tablet Take 1 tablet by mouth 3 (three) times daily.    [provider]  carvedilol (COREG) 25 MG tablet Take 25 mg by mouth 2 (two) times daily with a meal.    [provider]  cloNIDine (CATAPRES) 0.2 MG tablet Take 1 tablet by mouth Three times a day. 07/03/10   [provider]  dextromethorphan-guaiFENesin (MUCINEX DM) 30-600 MG per 12 hr tablet Take 1 tablet by mouth every 12 (twelve) hours.      [provider]  diclofenac (VOLTAREN) 50 MG EC tablet Take 50 mg by mouth 2 (two) times daily.    [provider]  diphenhydrAMINE (BENADRYL) 25 MG tablet Take 1 tablet (25 mg total) by mouth every 6 (six) hours as needed for itching or allergies. 05/08/17   Fayrene Helper, PA-C  furosemide (LASIX) 20 MG tablet Take 20 mg by mouth.    [provider]  hydrochlorothiazide 25 MG tablet Take 25 mg by mouth daily.      [provider]  levocetirizine (XYZAL) 5 MG tablet Take 5 mg by mouth every evening.    [provider]  losartan (COZAAR) 100 MG tablet Take 100 mg by mouth daily.      [provider]  metoCLOPramide (REGLAN) 5 MG tablet Take 5 mg by mouth 2 (two) times daily.  07/21/10   [provider]  metoprolol (TOPROL-XL) 50 MG 24 hr tablet Take 25 mg by mouth  daily.      [provider]  mometasone-formoterol (DULERA) 100-5 MCG/ACT AERO Inhale 2 puffs into the lungs 2 (two) times daily. 11/14/10   Storm Frisk, MD  nefazodone (SERZONE) 150 MG tablet Take 150 mg by mouth 2 (two) times daily.    [provider]  NEXIUM 40 MG capsule Take 1 capsule by mouth daily before breakfast. 07/07/10   [provider]  pantoprazole (PROTONIX) 40 MG tablet Take 40 mg by mouth daily.    [provider]  potassium chloride SA (K-DUR,KLOR-CON) 20 MEQ tablet Take 20 mEq by mouth 2 (two) times daily.      [provider]  Psyllium (METAMUCIL) 30.9 % POWD 2 (two) times daily.      [provider]  warfarin (COUMADIN) 5 MG tablet Take 5 mg by mouth as directed.      [provider]    Family History History reviewed. No pertinent family history.  Social History Social History   Tobacco Use  . Smoking status: Never Smoker  . Smokeless tobacco: Never Used  Substance Use Topics  . Alcohol use: No  . Drug use: Not on file     Allergies   Ciprofloxacin; Codeine; Latex; Penicillins; Sulfonamide derivatives; and Tiotropium bromide monohydrate   Review of Systems Review of Systems  Constitutional: Negative for activity change, fatigue and fever.  HENT: Negative for congestion.   Respiratory: Negative for shortness of breath.   Cardiovascular: Positive for leg swelling. Negative for chest pain.  Gastrointestinal: Negative for abdominal pain.  Genitourinary: Positive for dysuria.     Physical Exam Updated Vital Signs BP 133/66 (BP Location: Right Arm)   Pulse 73   Temp 98.9 F (37.2 C) (Oral)   Resp 18   Ht 5\' 3"  (1.6 m)   Wt 82.6 kg (182 lb)   SpO2 100%   BMI 32.24 kg/m   Physical Exam  Constitutional: She appears well-developed and well-nourished.  HENT:  Head: Normocephalic and atraumatic.  Eyes: Right eye exhibits no discharge. Left eye exhibits no discharge.  Cardiovascular:  Normal rate and regular rhythm.  Murmur heard. Pulmonary/Chest: Effort normal and breath sounds normal.  Abdominal: Soft. Bowel sounds are normal. She exhibits no distension. There is no tenderness.  Musculoskeletal: She exhibits edema.  + 3 pitting edema below knees.  Neurological:  Oriented X 3, but confused at times  Skin: Skin is warm and dry. She is not diaphoretic.  Psychiatric: She has a normal mood and affect.  Nursing note and vitals reviewed.    ED Treatments / Results  Labs (all labs ordered are listed, but only abnormal results are displayed) Labs  Reviewed  BASIC METABOLIC PANEL  CBC  TROPONIN I  BRAIN NATRIURETIC PEPTIDE  URINALYSIS, ROUTINE W REFLEX MICROSCOPIC    EKG None  Radiology No results found.  Procedures Procedures (including critical care time)  Medications Ordered in ED Medications - No data to display   Initial Impression / Assessment and Plan / ED Course  I have reviewed the triage vital signs and the nursing notes.  Pertinent labs & imaging results that were available during my care of the patient were reviewed by me and considered in my medical decision making (see chart for details).    Patient is an 84 year old female past medical history significant for GERD, COPD, hypertension, and bilateral lower extremity swelling.  She has been to the emergency department on 6 March, 13 March, 28 March, and now April 9.  Every time she is concerned about her lower extremity swelling.  We have made several changes to her medication.  When asked about them today, she reports "I did not read any of the instructions".  "But I promised to next time".  Patient thinks that the extra Lasix that she took is called to have caused her to have a urinary tract infections.  We discussed that this is not how the normal course of urinary tract infection goes, however she repeated it several times in the room "I just know that those pills cause me to have  infection".  Patient denies any shortness of breath.  Patient is supposed to be on 5-6 blood pressure medications.  It is unclear what she is actually taking.  She is been sent home every time with instructions follow-up with primary care physician.  She reports she's had trouble following up, and when she calls "he is always sick"  Called PCP.  Seems patient was last seen on on 3/14.    3:46 PM It seems that patient has not really failed outpatient treatment but she has not been taking her Lasix as prescribed.  Today I think what we will do is change the writing on the bottle.  In addition I called her primary care physician was able to get follow-up with her tomorrow morning.  I think that increasing some kind of social work or help at home might benefit patient greatly with med management.    Will check for UTI.    5:44 PM Urine shows likely UTI.  Will start an antibiotic therapy.  Give patient 2 choices 1.  To treat as an outpatient, and follow-up with her primary care physician the appointment that I made tomorrow.  I think this would be reasonable given that patient does not have any shortness of breath, no tachypnea.  I think that she has not really tried outpatient management strategy of increasing Lasix because she did not feel like it was necessary at that time.  In discussion with her husband, I think that we will be able to have patient follow the instructions if we change the written bottle and have him on board.  The other option was to be admitted to hospital, however I talked about the risks and benefits of hospitalization in her age group.  Decision made to attempt outpatient management first.  We will give her a shot of Lasix here.  We will have her increase her 20 mg of Lasix to 40 at home.  We will have her follow-up with her primary care physician tomorrow.  And also schedule a follow-up appointment with her cardiologist.  Of note she was seen  there 2 weeks ago had an EF of 55%,  however the last echo is unknown because we cannot see it in our system.  At that time she says she reported her swelling to her doctor who did not want to do nothing at that time.   Patient appears very well, normal vital signs and reassuring physical exam at the time of discharge.   We also discussed taking daily weights, and low-salt diet.  Discharge instructions below: 1. You have a urinary tract infection.  Please take the antibiotics as prescribed. 2.  3. We will need to follow-up with your primary care office tomorrow at 10:30 in the morning.  You need to talk about your swelling of your legs.  4. Please increase your Lasix (the small pills) to TWO PILLS DAILY.  The swelling in your legs is from your heart and these pills will help it. The nurse will write this on the bottle for you. Please tell your doctor tomorrow we are making this change.   5. Please call your cardiologist to make a follow-up appointment to talk about the swelling in your legs.  6. You need to buy a scale and weigh yourself every day to keep an eye on how much the swelling in your legs changes. 7.  7.  We have talked to case management  and hope that they will be able to come into your home and help you with your health needs.   Nurse Practioner tomorrow. 10:40 am at Dr. Georgena SpurlingGassemi office. Melissa Cousins.   Final Clinical Impressions(s) / ED Diagnoses   Final diagnoses:  None    ED Discharge Orders    None       Abelino DerrickMackuen, [facility] Lyn, MD 06/04/17 1836

## 2017-06-04 NOTE — ED Notes (Signed)
NAD at this time. Pt is stable and going home with daughter and husband.

## 2017-06-04 NOTE — Discharge Instructions (Addendum)
You have a urinary tract infection.  Please take the antibiotics as prescribed.  We will need to follow-up with your primary care office tomorrow at 10:30 in the morning.  You need to talk about your swelling of your legs.  Please increase your Lasix (the small pills) to TWO PILLS DAILY.  The swelling in your legs is from your heart and these pills will help it. The nurse will write this on the bottle for you. Please tell your doctor tomorrow we are making this change.   Please call your cardiologist to make a follow-up appointment to talk about the swelling in your legs.  You need to buy a scale and weigh yourself every day to keep an eye on how much the swelling in your legs changes.  7.  We have talked to case management  and hope that they will be able to come into your home and help you with your health needs.

## 2017-06-07 LAB — URINE CULTURE

## 2018-04-01 ENCOUNTER — Emergency Department (HOSPITAL_BASED_OUTPATIENT_CLINIC_OR_DEPARTMENT_OTHER)
Admission: EM | Admit: 2018-04-01 | Discharge: 2018-04-01 | Disposition: A | Discharge status: Home / Self Care | Payer: Medicare HMO | Attending: Emergency Medicine | Admitting: Emergency Medicine

## 2018-04-01 ENCOUNTER — Encounter (HOSPITAL_BASED_OUTPATIENT_CLINIC_OR_DEPARTMENT_OTHER): Payer: Self-pay | Admitting: *Deleted

## 2018-04-01 ENCOUNTER — Other Ambulatory Visit: Payer: Self-pay

## 2018-04-01 ENCOUNTER — Emergency Department (HOSPITAL_BASED_OUTPATIENT_CLINIC_OR_DEPARTMENT_OTHER): Payer: Medicare HMO

## 2018-04-01 DIAGNOSIS — I10 Essential (primary) hypertension: Secondary | ICD-10-CM | POA: Diagnosis not present

## 2018-04-01 DIAGNOSIS — J449 Chronic obstructive pulmonary disease, unspecified: Secondary | ICD-10-CM | POA: Insufficient documentation

## 2018-04-01 DIAGNOSIS — R0602 Shortness of breath: Secondary | ICD-10-CM | POA: Insufficient documentation

## 2018-04-01 DIAGNOSIS — Z79899 Other long term (current) drug therapy: Secondary | ICD-10-CM | POA: Insufficient documentation

## 2018-04-01 DIAGNOSIS — Z7901 Long term (current) use of anticoagulants: Secondary | ICD-10-CM | POA: Diagnosis not present

## 2018-04-01 DIAGNOSIS — J069 Acute upper respiratory infection, unspecified: Secondary | ICD-10-CM | POA: Insufficient documentation

## 2018-04-01 DIAGNOSIS — Z950 Presence of cardiac pacemaker: Secondary | ICD-10-CM | POA: Insufficient documentation

## 2018-04-01 DIAGNOSIS — R531 Weakness: Secondary | ICD-10-CM

## 2018-04-01 DIAGNOSIS — Z91040 Latex allergy status: Secondary | ICD-10-CM | POA: Diagnosis not present

## 2018-04-01 LAB — COMPREHENSIVE METABOLIC PANEL
ALT: 12 U/L (ref 0–44)
AST: 19 U/L (ref 15–41)
Albumin: 4.2 g/dL (ref 3.5–5.0)
Alkaline Phosphatase: 39 U/L (ref 38–126)
Anion gap: 7 (ref 5–15)
BUN: 11 mg/dL (ref 8–23)
CO2: 25 mmol/L (ref 22–32)
Calcium: 9.3 mg/dL (ref 8.9–10.3)
Chloride: 104 mmol/L (ref 98–111)
Creatinine, Ser: 0.96 mg/dL (ref 0.44–1.00)
GFR calc Af Amer: >60 mL/min (ref >60)
GFR calc non Af Amer: 53 mL/min — ABNORMAL LOW (ref >60)
GLUCOSE: 104 mg/dL — AB (ref 70–99)
Potassium: 3.7 mmol/L (ref 3.5–5.1)
SODIUM: 136 mmol/L (ref 135–145)
Total Bilirubin: 0.7 mg/dL (ref 0.3–1.2)
Total Protein: 7.8 g/dL (ref 6.5–8.1)

## 2018-04-01 LAB — TSH: TSH: 2.025 u[IU]/mL (ref 0.350–4.500)

## 2018-04-01 LAB — CBC
HCT: 37.6 % (ref 36.0–46.0)
HEMOGLOBIN: 12.3 g/dL (ref 12.0–15.0)
MCH: 32.3 pg (ref 26.0–34.0)
MCHC: 32.7 g/dL (ref 30.0–36.0)
MCV: 98.7 fL (ref 80.0–100.0)
PLATELETS: 188 10*3/uL (ref 150–400)
RBC: 3.81 MIL/uL — ABNORMAL LOW (ref 3.87–5.11)
RDW: 15.2 % (ref 11.5–15.5)
WBC: 4.9 10*3/uL (ref 4.0–10.5)
nRBC: 0 % (ref 0.0–0.2)

## 2018-04-01 LAB — URINALYSIS, ROUTINE W REFLEX MICROSCOPIC
BILIRUBIN URINE: NEGATIVE
Glucose, UA: NEGATIVE mg/dL
Hgb urine dipstick: NEGATIVE
KETONES UR: NEGATIVE mg/dL
Leukocytes, UA: NEGATIVE
Nitrite: NEGATIVE
PROTEIN: NEGATIVE mg/dL
Specific Gravity, Urine: 1.01 (ref 1.005–1.030)
pH: 6 (ref 5.0–8.0)

## 2018-04-01 LAB — BRAIN NATRIURETIC PEPTIDE: B Natriuretic Peptide: 176 pg/mL — ABNORMAL HIGH (ref 0.0–100.0)

## 2018-04-01 LAB — TROPONIN I: Troponin I: <0.03 ng/mL (ref <0.03)

## 2018-04-01 LAB — GROUP A STREP BY PCR: Group A Strep by PCR: NOT DETECTED

## 2018-04-01 LAB — T4, FREE: Free T4: 0.93 ng/dL (ref 0.82–1.77)

## 2018-04-01 MED ORDER — ALBUTEROL SULFATE (2.5 MG/3ML) 0.083% IN NEBU
2.5000 mg | INHALATION_SOLUTION | RESPIRATORY_TRACT | Status: AC
  Administered 2018-04-01 (×2): 2.5 mg via RESPIRATORY_TRACT
  Filled 2018-04-01 (×2): qty 3

## 2018-04-01 MED ORDER — PSEUDOEPH-BROMPHEN-DM 30-2-10 MG/5ML PO SYRP: 5.0000 mL | ORAL_SOLUTION | Freq: Four times a day (QID) | ORAL | 0 refills | Status: AC | PRN

## 2018-04-01 NOTE — Discharge Instructions (Addendum)
Thank you for allowing me to care for you today. Please return to the emergency department if you have new or worsening symptoms. Use your inhaler every 4 hours while sick. Take your medications as instructed.

## 2018-04-01 NOTE — ED Notes (Signed)
Pt verbalizes understanding of dc instructions and denies any further needs at this time. Pt wheeled to waiting room to await transportation home.

## 2018-04-01 NOTE — ED Notes (Signed)
Pt unable to Provide urine sample.

## 2018-04-01 NOTE — ED Notes (Signed)
Pt c/o chronic generalized pain, throat pain and decreased appetite for the last two weeks. Pt was just seen by PCP last week for a physical but did not discuss her concerns with him.

## 2018-04-01 NOTE — ED Provider Notes (Signed)
[facility] EMERGENCY DEPARTMENT Provider Note   CSN: [id number] Arrival date & time: 04/01/18  1359     History   Chief Complaint Chief Complaint  Patient presents with  . Weakness    HPI Kara Sanders is a [age] y.o. female.  Patient is an 85 year old female past medical history of CHF, COPD, dysphagia, hypertension, second-degree AV block with pacemaker in place who presents the emergency department for generalized weakness.  She reports that this is been going on for a week.  Endorses shortness of breath and feels like she is "breathing from my stomach".  Reports that she usually gets around with a walker at home but that she felt too weak to get out of bed today.  She is not coughing or having any other URI symptoms but does report that she is having sore throat and trouble swallowing.  Reports that she has lost weight in the past several months.  Reports that she is swallowing liquids.  Upon review of her notes patient has had this same problem for several years.  Has been seen by a specialist with imaging that showed possible laryngopharyngeal reflux.  Also it was thought that her inhalers may be irritating her throat as well.  She currently denies any fever, chills, nausea, vomiting, chest pain, increased leg swelling     Past Medical History:  Diagnosis Date  . COPD, moderate (HCC)   . GERD (gastroesophageal reflux disease)   . Hiatal hernia   . HTN (hypertension)   . Mobitz type 2 second degree AV block     Patient Active Problem List   Diagnosis Date Noted  . Asthmatic bronchitis 08/21/2010  . SLEEP APNEA, OBSTRUCTIVE 03/09/2010  . BRADYCARDIA 10/22/2008  . HYPERTENSION 10/21/2008  . HIATAL HERNIA, HX OF 10/21/2008  . G E R D 02/04/2008    Past Surgical History:  Procedure Laterality Date  . COLONOSCOPY  09/22/02  . ESOPHAGOGASTRODUODENOSCOPY  12/20/98  . KNEE SURGERY     bilateral  . PACEMAKER PLACEMENT       OB History   No obstetric history  on file.      Home Medications    Prior to Admission medications   Medication Sig Start Date End Date Taking? Authorizing Provider  albuterol (VENTOLIN HFA) 108 (90 BASE) MCG/ACT inhaler Inhale 2 puffs into the lungs every 6 (six) hours as needed. 10/18/10   Storm FriskWright, Patrick E, MD  ALPRAZolam Prudy Feeler(XANAX) 0.5 MG tablet 1/2 tab by mouth at bedtime 07/29/10   [provider]  amLODipine (NORVASC) 5 MG tablet Take 5 mg by mouth daily.      [provider]  brompheniramine-pseudoephedrine-DM 30-2-10 MG/5ML syrup Take 5 mLs by mouth 4 (four) times daily as needed for up to 5 days. 04/01/18 04/06/18  Ronnie DossMcLean, Gardy Montanari A, PA-C  carbidopa-levodopa-entacapone (STALEVO) 25-100-200 MG tablet Take 1 tablet by mouth 3 (three) times daily.    [provider]  carvedilol (COREG) 25 MG tablet Take 25 mg by mouth 2 (two) times daily with a meal.    [provider]  cephALEXin (KEFLEX) 500 MG capsule Take 1 capsule (500 mg total) by mouth 4 (four) times daily. 06/04/17   Mackuen, Courteney Lyn, MD  cloNIDine (CATAPRES) 0.2 MG tablet Take 1 tablet by mouth Three times a day. 07/03/10   [provider]  dextromethorphan-guaiFENesin (MUCINEX DM) 30-600 MG per 12 hr tablet Take 1 tablet by mouth every 12 (twelve) hours.      [provider]  diclofenac (VOLTAREN) 50 MG EC tablet Take 50 mg by mouth 2 (two) times daily.    [provider]  diphenhydrAMINE (BENADRYL) 25 MG tablet Take 1 tablet (25 mg total) by mouth every 6 (six) hours as needed for itching or allergies. 05/08/17   Fayrene Helper, PA-C  furosemide (LASIX) 20 MG tablet Take 20 mg by mouth.    [provider]  hydrochlorothiazide 25 MG tablet Take 25 mg by mouth daily.      [provider]  levocetirizine (XYZAL) 5 MG tablet Take 5 mg by mouth every evening.    [provider]  losartan (COZAAR) 100 MG tablet Take 100 mg by mouth daily.      [provider]  metoCLOPramide  (REGLAN) 5 MG tablet Take 5 mg by mouth 2 (two) times daily.  07/21/10   [provider]  metoprolol (TOPROL-XL) 50 MG 24 hr tablet Take 25 mg by mouth daily.      [provider]  mometasone-formoterol (DULERA) 100-5 MCG/ACT AERO Inhale 2 puffs into the lungs 2 (two) times daily. 11/14/10   Storm Frisk, MD  nefazodone (SERZONE) 150 MG tablet Take 150 mg by mouth 2 (two) times daily.    [provider]  NEXIUM 40 MG capsule Take 1 capsule by mouth daily before breakfast. 07/07/10   [provider]  pantoprazole (PROTONIX) 40 MG tablet Take 40 mg by mouth daily.    [provider]  potassium chloride SA (K-DUR,KLOR-CON) 20 MEQ tablet Take 20 mEq by mouth 2 (two) times daily.      [provider]  Psyllium (METAMUCIL) 30.9 % POWD 2 (two) times daily.      [provider]  warfarin (COUMADIN) 5 MG tablet Take 5 mg by mouth as directed.      [provider]    Family History History reviewed. No pertinent family history.  Social History Social History   Tobacco Use  . Smoking status: Never Smoker  . Smokeless tobacco: Never Used  Substance Use Topics  . Alcohol use: No  . Drug use: Not on file     Allergies   Ciprofloxacin; Codeine; Latex; Penicillins; Sulfonamide derivatives; and Tiotropium bromide monohydrate   Review of Systems Review of Systems  Constitutional: Positive for appetite change and fatigue. Negative for activity change, chills, diaphoresis, fever and unexpected weight change.  HENT: Positive for sore throat and trouble swallowing. Negative for congestion, dental problem, drooling, ear discharge, ear pain, facial swelling, hearing loss, mouth sores, nosebleeds, postnasal drip, rhinorrhea, sinus pressure, sinus pain, sneezing, tinnitus and voice change.   Eyes: Negative.   Respiratory: Positive for shortness of breath. Negative for apnea, cough, choking, chest tightness, wheezing and stridor.     Cardiovascular: Negative for chest pain, palpitations and leg swelling.  Gastrointestinal: Negative.  Negative for abdominal pain, blood in stool, constipation, diarrhea, nausea and vomiting.  Genitourinary: Negative for dysuria.  Musculoskeletal: Negative for back pain, joint swelling, myalgias, neck pain and neck stiffness.  Skin: Negative for rash and wound.  Neurological: Positive for weakness (Generalized). Negative for dizziness, seizures, syncope, speech difficulty, light-headedness, numbness and headaches.     Physical Exam Updated Vital Signs BP (!) 182/73 (BP Location: Left Arm)   Pulse 73   Temp 98.1 F (36.7 C) (Oral)   Resp 16   Ht 5\' 3"  (1.6 m)   Wt 78 kg   SpO2 96%   BMI 30.47 kg/m   Physical Exam Vitals  signs reviewed.  Constitutional:      General: She is not in acute distress.    Appearance: Normal appearance. She is obese. She is not ill-appearing, toxic-appearing or diaphoretic.  HENT:     Head: Normocephalic and atraumatic.     Right Ear: Tympanic membrane normal. There is no impacted cerumen.     Left Ear: Tympanic membrane normal. There is no impacted cerumen.     Nose: Nose normal. No congestion or rhinorrhea.     Mouth/Throat:     Mouth: Mucous membranes are moist.     Tongue: No lesions. Tongue does not protrude in midline.     Palate: No mass and lesions. Palate does not elevate in midline.     Pharynx: Oropharynx is clear. No oropharyngeal exudate or posterior oropharyngeal erythema.     Tonsils: No tonsillar exudate or tonsillar abscesses.  Eyes:     Conjunctiva/sclera: Conjunctivae normal.     Pupils: Pupils are equal, round, and reactive to light.  Cardiovascular:     Rate and Rhythm: Normal rate and regular rhythm.     Pulses: Normal pulses.     Heart sounds: Normal heart sounds.  Pulmonary:     Effort: Pulmonary effort is normal. No respiratory distress.     Breath sounds: No stridor. Wheezing (Bilateral, expiratory, mild) present. No  rhonchi.  Chest:     Chest wall: No tenderness.  Abdominal:     General: Abdomen is flat. Bowel sounds are normal. There is no distension.     Palpations: There is no mass.     Tenderness: There is no abdominal tenderness. There is no right CVA tenderness, left CVA tenderness or guarding.  Skin:    General: Skin is warm.     Capillary Refill: Capillary refill takes less than 2 seconds.     Coloration: Skin is not jaundiced.     Findings: No erythema or rash.  Neurological:     General: No focal deficit present.     Mental Status: She is alert.      ED Treatments / Results  Labs (all labs ordered are listed, but only abnormal results are displayed) Labs Reviewed  CBC - Abnormal; Notable for the following components:      Result Value   RBC 3.81 (*)    All other components within normal limits  BRAIN NATRIURETIC PEPTIDE - Abnormal; Notable for the following components:   B Natriuretic Peptide 176.0 (*)    All other components within normal limits  COMPREHENSIVE METABOLIC PANEL - Abnormal; Notable for the following components:   Glucose, Bld 104 (*)    GFR calc non Af Amer 53 (*)    All other components within normal limits  GROUP A STREP BY PCR  URINALYSIS, ROUTINE W REFLEX MICROSCOPIC  TROPONIN I  TSH  T4, FREE    EKG EKG Interpretation  Date/Time:  Tuesday April 01 2018 14:15:18 EST Ventricular Rate:  77 PR Interval:  184 QRS Duration: 154 QT Interval:  456 QTC Calculation: 516 R Axis:   -71 Text Interpretation:  AV dual-paced rhythm Abnormal ECG No significant change since last tracing Confirmed by Linwood Dibbles [phone]) on 04/01/2018 2:25:43 PM   Radiology Dg Chest 2 View  Result Date: 04/01/2018 CLINICAL DATA:  Chronic generalized pain. Shortness of breath. EXAM: CHEST - 2 VIEW COMPARISON:  06/04/2017. FINDINGS: Stable enlarged cardiac silhouette and left subclavian pacemaker leads. The pulmonary vasculature remains prominent and the interstitial markings  remain minimally prominent. No visible  pleural fluid. Unremarkable bones. IMPRESSION: Stable cardiomegaly, pulmonary vascular congestion and minimal chronic interstitial lung disease. Electronically Signed   By: Beckie Salts M.D.   On: 04/01/2018 17:25    Procedures Procedures (including critical care time)  Medications Ordered in ED Medications  albuterol (PROVENTIL) (2.5 MG/3ML) 0.083% nebulizer solution 2.5 mg (2.5 mg Nebulization Given 04/01/18 1614)     Initial Impression / Assessment and Plan / ED Course  I have reviewed the triage vital signs and the nursing notes.  Pertinent labs & imaging results that were available during my care of the patient were reviewed by me and considered in my medical decision making (see chart for details).  Clinical Course as of Apr 01 1800  Tue Apr 01, 2018  1737 Patient was discussed with Dr. Rush Landmark and plan agreed upon.  Dr. Rush Landmark also in the room to see the patient.  Patient work-up is unremarkable.  Her BNP is at her baseline of 176.  No pulmonary edema on her x-ray.  I gave her a few breathing treatments due to the wheezing I heard on exam.  Her sore throat is not new and she has been having the same issue for several years.  May be related to her inhaler use.  TSH and T4 are pending. We will treat for acute URI.  Patient agrees to the plan.   [KM]    Clinical Course User Index [KM] Arlyn Dunning, PA-C    Based on review of vitals, medical screening exam, lab work and/or imaging, there does not appear to be an acute, emergent etiology for the patient's symptoms. Counseled pt on good return precautions and encouraged both PCP and ED follow-up as needed.  Prior to discharge, I also discussed incidental imaging findings with patient in detail and advised appropriate, recommended follow-up in detail.  Clinical Impression: 1. Acute URI   2. Generalized weakness     Disposition: Discharge    This note was prepared with assistance of  Dragon voice recognition software. Occasional wrong-word or sound-a-like substitutions may have occurred due to the inherent limitations of voice recognition software.   Final Clinical Impressions(s) / ED Diagnoses   Final diagnoses:  Acute URI  Generalized weakness    ED Discharge Orders         Ordered    brompheniramine-pseudoephedrine-DM 30-2-10 MG/5ML syrup  4 times daily PRN     04/01/18 1756           Jeral Pinch 04/01/18 Gillermina Hu, MD 04/02/18 1108

## 2018-04-01 NOTE — ED Notes (Signed)
Patient transported to X-ray 

## 2018-04-01 NOTE — ED Triage Notes (Signed)
Pt c/o generalized weakness and body aches x 2 days

## 2018-04-05 ENCOUNTER — Emergency Department (HOSPITAL_COMMUNITY): Payer: Medicare HMO

## 2018-04-05 ENCOUNTER — Other Ambulatory Visit: Payer: Self-pay

## 2018-04-05 ENCOUNTER — Encounter (HOSPITAL_COMMUNITY): Payer: Self-pay | Admitting: Emergency Medicine

## 2018-04-05 ENCOUNTER — Emergency Department (HOSPITAL_COMMUNITY)
Admission: EM | Admit: 2018-04-05 | Discharge: 2018-04-06 | Disposition: A | Discharge status: Home / Self Care | Payer: Medicare HMO | Attending: Emergency Medicine | Admitting: Emergency Medicine

## 2018-04-05 DIAGNOSIS — Y929 Unspecified place or not applicable: Secondary | ICD-10-CM | POA: Insufficient documentation

## 2018-04-05 DIAGNOSIS — Z950 Presence of cardiac pacemaker: Secondary | ICD-10-CM | POA: Insufficient documentation

## 2018-04-05 DIAGNOSIS — Z7901 Long term (current) use of anticoagulants: Secondary | ICD-10-CM | POA: Diagnosis not present

## 2018-04-05 DIAGNOSIS — B9789 Other viral agents as the cause of diseases classified elsewhere: Secondary | ICD-10-CM | POA: Insufficient documentation

## 2018-04-05 DIAGNOSIS — I10 Essential (primary) hypertension: Secondary | ICD-10-CM | POA: Diagnosis not present

## 2018-04-05 DIAGNOSIS — J029 Acute pharyngitis, unspecified: Secondary | ICD-10-CM | POA: Diagnosis not present

## 2018-04-05 DIAGNOSIS — Z91040 Latex allergy status: Secondary | ICD-10-CM | POA: Insufficient documentation

## 2018-04-05 DIAGNOSIS — Y999 Unspecified external cause status: Secondary | ICD-10-CM | POA: Insufficient documentation

## 2018-04-05 DIAGNOSIS — J449 Chronic obstructive pulmonary disease, unspecified: Secondary | ICD-10-CM | POA: Insufficient documentation

## 2018-04-05 DIAGNOSIS — Y9301 Activity, walking, marching and hiking: Secondary | ICD-10-CM | POA: Insufficient documentation

## 2018-04-05 DIAGNOSIS — W19XXXA Unspecified fall, initial encounter: Secondary | ICD-10-CM

## 2018-04-05 DIAGNOSIS — R634 Abnormal weight loss: Secondary | ICD-10-CM | POA: Diagnosis not present

## 2018-04-05 DIAGNOSIS — W0110XA Fall on same level from slipping, tripping and stumbling with subsequent striking against unspecified object, initial encounter: Secondary | ICD-10-CM | POA: Diagnosis not present

## 2018-04-05 DIAGNOSIS — D696 Thrombocytopenia, unspecified: Secondary | ICD-10-CM | POA: Insufficient documentation

## 2018-04-05 DIAGNOSIS — Z79899 Other long term (current) drug therapy: Secondary | ICD-10-CM | POA: Insufficient documentation

## 2018-04-05 LAB — URINALYSIS, ROUTINE W REFLEX MICROSCOPIC
Bilirubin Urine: NEGATIVE
Glucose, UA: NEGATIVE mg/dL
Hgb urine dipstick: NEGATIVE
Ketones, ur: 5 mg/dL — AB
Leukocytes, UA: NEGATIVE
Nitrite: NEGATIVE
Protein, ur: NEGATIVE mg/dL
Specific Gravity, Urine: 1.005 (ref 1.005–1.030)
pH: 5 (ref 5.0–8.0)

## 2018-04-05 LAB — COMPREHENSIVE METABOLIC PANEL
ALT: 8 U/L (ref 0–44)
AST: 21 U/L (ref 15–41)
Albumin: 3.7 g/dL (ref 3.5–5.0)
Alkaline Phosphatase: 31 U/L — ABNORMAL LOW (ref 38–126)
Anion gap: 13 (ref 5–15)
BUN: 12 mg/dL (ref 8–23)
CO2: 22 mmol/L (ref 22–32)
Calcium: 9.2 mg/dL (ref 8.9–10.3)
Chloride: 103 mmol/L (ref 98–111)
Creatinine, Ser: 0.97 mg/dL (ref 0.44–1.00)
GFR calc Af Amer: >60 mL/min (ref >60)
GFR calc non Af Amer: 53 mL/min — ABNORMAL LOW (ref >60)
Glucose, Bld: 93 mg/dL (ref 70–99)
Potassium: 3.5 mmol/L (ref 3.5–5.1)
Sodium: 138 mmol/L (ref 135–145)
Total Bilirubin: 0.9 mg/dL (ref 0.3–1.2)
Total Protein: 7 g/dL (ref 6.5–8.1)

## 2018-04-05 LAB — CBC WITH DIFFERENTIAL/PLATELET
Abs Immature Granulocytes: 0.01 10*3/uL (ref 0.00–0.07)
Basophils Absolute: 0 10*3/uL (ref 0.0–0.1)
Basophils Relative: 0 %
EOS ABS: 0.1 10*3/uL (ref 0.0–0.5)
Eosinophils Relative: 1 %
HEMATOCRIT: 37.1 % (ref 36.0–46.0)
Hemoglobin: 11.5 g/dL — ABNORMAL LOW (ref 12.0–15.0)
Immature Granulocytes: 0 %
Lymphocytes Relative: 35 %
Lymphs Abs: 1.4 10*3/uL (ref 0.7–4.0)
MCH: 31.8 pg (ref 26.0–34.0)
MCHC: 31 g/dL (ref 30.0–36.0)
MCV: 102.5 fL — ABNORMAL HIGH (ref 80.0–100.0)
Monocytes Absolute: 0.7 10*3/uL (ref 0.1–1.0)
Monocytes Relative: 16 %
Neutro Abs: 1.9 10*3/uL (ref 1.7–7.7)
Neutrophils Relative %: 48 %
Platelets: 56 10*3/uL — ABNORMAL LOW (ref 150–400)
RBC: 3.62 MIL/uL — ABNORMAL LOW (ref 3.87–5.11)
RDW: 15.6 % — AB (ref 11.5–15.5)
WBC: 4.1 10*3/uL (ref 4.0–10.5)
nRBC: 0 % (ref 0.0–0.2)

## 2018-04-05 LAB — LIPASE, BLOOD: Lipase: 23 U/L (ref 11–51)

## 2018-04-05 LAB — I-STAT TROPONIN, ED: Troponin i, poc: 0.03 ng/mL (ref 0.00–0.08)

## 2018-04-05 LAB — PHOSPHORUS: Phosphorus: 4 mg/dL (ref 2.5–4.6)

## 2018-04-05 LAB — INFLUENZA PANEL BY PCR (TYPE A & B)
Influenza A By PCR: NEGATIVE
Influenza B By PCR: NEGATIVE

## 2018-04-05 LAB — MAGNESIUM: Magnesium: 2 mg/dL (ref 1.7–2.4)

## 2018-04-05 MED ORDER — IOHEXOL 300 MG/ML  SOLN
100.0000 mL | Freq: Once | INTRAMUSCULAR | Status: AC | PRN
  Administered 2018-04-05: 100 mL via INTRAVENOUS

## 2018-04-05 MED ORDER — SODIUM CHLORIDE 0.9 % IV BOLUS
500.0000 mL | Freq: Once | INTRAVENOUS | Status: AC
  Administered 2018-04-05: 500 mL via INTRAVENOUS

## 2018-04-05 NOTE — ED Triage Notes (Signed)
Pt to ED via [facility] with c/o sore throat and decreased appetite x's 2 weeks   Pt was seen at [facility] a few days ago for same.  Pt st's they said nothing was wrong.

## 2018-04-05 NOTE — ED Notes (Signed)
Spoke to pt about need for urine sample, pt repots that she has been incontinent of urine in brief. Assisted pt into clean brief and to use purewick device

## 2018-04-05 NOTE — ED Provider Notes (Signed)
[facility]   CSN: [id number] Arrival date & time: 04/05/18  1554     History   Chief Complaint Chief Complaint  Patient presents with  . Sore Throat    HPI Kara Sanders is a [age] y.o. female.  HPI   Kara Sanders is a 85 y.o. female with PMH of COPD, GERD, hiatal hernia, hypertension, Mobitz type II AV block status post pacemaker placement who presents via EMS from home with sore throat and generalized fatigue, decreased appetite, and lower extremity weakness.  She reports she has felt decreased appetite and fatigued and weak for about 2 months.  No fevers or chills.  Was seen in the ED several days ago and was told "nothing is wrong" per her report.  She has had sore throat for about a week.  Small amount of cough which is nonproductive.  Mild dyspnea at times.  No chest pain.  No lightheadedness.  Able to ambulate with her walker normally but for the last couple of days has been slightly more weak.  Has had difficulty getting up into her bed.  Her husband is very ill and cannot help much at home.  She states she got up this morning and went to go to the bathroom.  When walking to the restroom with her walker her legs "gave out" and she fell onto her bottom.  Did not hit her head or neck and did not lose consciousness.  No chest pain or palpitations or presyncopal symptoms.  Her son came over and helped her stand up.  Past Medical History:  Diagnosis Date  . COPD, moderate (HCC)   . GERD (gastroesophageal reflux disease)   . Hiatal hernia   . HTN (hypertension)   . Mobitz type 2 second degree AV block     Patient Active Problem List   Diagnosis Date Noted  . Asthmatic bronchitis 08/21/2010  . SLEEP APNEA, OBSTRUCTIVE 03/09/2010  . BRADYCARDIA 10/22/2008  . HYPERTENSION 10/21/2008  . HIATAL HERNIA, HX OF 10/21/2008  . G E R D 02/04/2008    Past Surgical History:  Procedure Laterality Date  . COLONOSCOPY  09/22/02  .  ESOPHAGOGASTRODUODENOSCOPY  12/20/98  . KNEE SURGERY     bilateral  . PACEMAKER PLACEMENT       OB History   No obstetric history on file.      Home Medications    Prior to Admission medications   Medication Sig Start Date End Date Taking? Authorizing Provider  acetaminophen (TYLENOL) 500 MG tablet Take 1,500 mg by mouth 2 (two) times daily as needed for headache (pain).   Yes [provider]  albuterol (PROVENTIL) (2.5 MG/3ML) 0.083% nebulizer solution Take 2.5 mg by nebulization every 6 (six) hours as needed for wheezing.   Yes [provider]  albuterol (VENTOLIN HFA) 108 (90 BASE) MCG/ACT inhaler Inhale 2 puffs into the lungs every 6 (six) hours as needed. Patient taking differently: Inhale 2 puffs into the lungs every 6 (six) hours as needed for wheezing.  10/18/10  Yes Storm FriskWright, Patrick E, MD  ALPRAZolam Prudy Feeler(XANAX) 0.25 MG tablet Take 0.25 mg by mouth See admin instructions. Take one tablet (0.25 mg) by mouth every morning and two tablets (0.5 mg) at night as needed for anxiety/sleep 07/29/10  Yes [provider]  amLODipine (NORVASC) 10 MG tablet Take 10 mg by mouth daily. 03/04/18  Yes [provider]  calcium-vitamin D (OSCAL WITH D) 500-200 MG-UNIT tablet Take 2  tablets by mouth daily.   Yes [provider]  carvedilol (COREG) 25 MG tablet Take 25 mg by mouth 2 (two) times daily with a meal.   Yes [provider]  cloNIDine (CATAPRES) 0.1 MG tablet Take 0.1 mg by mouth 2 (two) times daily. 01/07/18  Yes [provider]  diphenhydrAMINE (BENADRYL) 25 MG tablet Take 1 tablet (25 mg total) by mouth every 6 (six) hours as needed for itching or allergies. 05/08/17  Yes Fayrene Helper, PA-C  fluticasone (FLOVENT HFA) 110 MCG/ACT inhaler Inhale 1 puff into the lungs 2 (two) times daily.   Yes [provider]  furosemide (LASIX) 20 MG tablet Take 20 mg by mouth 2 (two) times daily.    Yes [provider]    isosorbide-hydrALAZINE (BIDIL) 20-37.5 MG tablet Take 1 tablet by mouth 2 (two) times daily.   Yes [provider]  levocetirizine (XYZAL) 5 MG tablet Take 5 mg by mouth every evening.   Yes [provider]  losartan (COZAAR) 100 MG tablet Take 100 mg by mouth daily.     Yes [provider]  montelukast (SINGULAIR) 10 MG tablet Take 10 mg by mouth at bedtime.   Yes [provider]  pantoprazole (PROTONIX) 40 MG tablet Take 40 mg by mouth daily before breakfast.    Yes [provider]  Psyllium (METAMUCIL) 30.9 % POWD Take 1 Scoop by mouth 2 (two) times daily as needed (constipation).    Yes [provider]  brompheniramine-pseudoephedrine-DM 30-2-10 MG/5ML syrup Take 5 mLs by mouth 4 (four) times daily as needed for up to 5 days. 04/01/18 04/06/18  Ronnie Doss A, PA-C  cycloSPORINE (RESTASIS) 0.05 % ophthalmic emulsion Place 1 drop into both eyes 2 (two) times daily.    [provider]  dextromethorphan-guaiFENesin (MUCINEX DM) 30-600 MG per 12 hr tablet Take 1 tablet by mouth every 12 (twelve) hours.      [provider]  diclofenac sodium (VOLTAREN) 1 % GEL Apply topically.    [provider]  magnesium oxide (MAG-OX) 400 MG tablet Take 400 mg by mouth daily.    [provider]  mometasone-formoterol (DULERA) 100-5 MCG/ACT AERO Inhale 2 puffs into the lungs 2 (two) times daily. 11/14/10   Storm Frisk, MD  potassium chloride SA (K-DUR,KLOR-CON) 20 MEQ tablet Take 20 mEq by mouth 2 (two) times daily.      [provider]  Propylene Glycol (SYSTANE BALANCE) 0.6 % SOLN Place 1 drop into both eyes.    [provider]  sertraline (ZOLOFT) 100 MG tablet Take 150 mg by mouth daily. 03/30/18   [provider]  warfarin (COUMADIN) 5 MG tablet Take 5 mg by mouth as directed.      [provider]    Family History No family history on file.  Social History Social History    Tobacco Use  . Smoking status: Never Smoker  . Smokeless tobacco: Never Used  Substance Use Topics  . Alcohol use: No  . Drug use: Not on file     Allergies   Ciprofloxacin; Codeine; Latex; Penicillins; Sulfonamide derivatives; and Tiotropium bromide monohydrate   Review of Systems Review of Systems  Constitutional: Positive for activity change, appetite change and fatigue. Negative for chills and fever.  HENT: Positive for sore throat and trouble swallowing (Odynophagia but no dysphagia). Negative for congestion, ear pain, postnasal drip, rhinorrhea, sinus pressure, sneezing and voice change.   Eyes: Negative for pain and visual disturbance.  Respiratory: Negative for cough and shortness of breath.   Cardiovascular: Negative for chest pain and palpitations.  Gastrointestinal: Negative for abdominal pain and vomiting.  Genitourinary: Negative for dysuria and hematuria.  Musculoskeletal: Negative for arthralgias and back pain.  Skin: Negative for color change and rash.  Neurological: Positive for weakness. Negative for seizures and syncope.  All other systems reviewed and are negative.    Physical Exam Updated Vital Signs BP (!) 176/74   Pulse 73   Temp 97.9 F (36.6 C) (Oral)   Resp (!) 23   Ht 5\' 3"  (1.6 m)   Wt 77.6 kg   SpO2 97%   BMI 30.29 kg/m   Physical Exam Vitals signs and nursing note reviewed.  Constitutional:      General: She is not in acute distress.    Appearance: Normal appearance. She is well-developed. She is ill-appearing (Chronically ill-appearing).  HENT:     Head: Normocephalic and atraumatic.     Jaw: There is normal jaw occlusion.     Mouth/Throat:     Lips: Pink.     Mouth: Mucous membranes are moist.     Pharynx: Oropharynx is clear. Posterior oropharyngeal erythema (mild) present.     Tonsils: No tonsillar exudate or tonsillar abscesses. Swelling: 0 on the right. 0 on the left.     Comments: No asymmetry, RPA, PTA or tonsillar  abscess.  No exudate.  No erythema.  No fluctuance.  No masses or other abnormalities visible. Eyes:     General: Lids are normal. Vision grossly intact.     Conjunctiva/sclera: Conjunctivae normal.  Neck:     Musculoskeletal: Neck supple.  Cardiovascular:     Rate and Rhythm: Normal rate and regular rhythm.     Heart sounds: No murmur.  Pulmonary:     Effort: Pulmonary effort is normal. No respiratory distress.     Breath sounds: Normal breath sounds. No decreased breath sounds or rhonchi.  Abdominal:     General: Abdomen is flat.     Palpations: Abdomen is soft.     Tenderness: There is no abdominal tenderness. There is no guarding or rebound. Negative signs include Murphy's sign, Rovsing's sign and McBurney's sign.  Musculoskeletal:     Comments: No pain about patient in the upper or lower extremities or pelvis.  Skin:    General: Skin is warm and dry.     Capillary Refill: Capillary refill takes less than 2 seconds.  Neurological:     General: No focal deficit present.     Mental Status: She is alert and oriented to person, place, and time.     GCS: GCS eye subscore is 4. GCS verbal subscore is 5. GCS motor subscore is 6.     Cranial Nerves: Cranial nerves are intact.     Sensory: Sensation is intact.     Motor: Motor function is intact.     Coordination: Coordination is intact. Finger-Nose-Finger Test normal.     Comments: Strength is 4+ out of 5 in the bilateral lower extremities and 4 out of 5 in the bilateral upper extremities.  No sensory deficits.  Speech is clear.  Carries conversation well.  Psychiatric:        Behavior: Behavior is cooperative.      ED Treatments / Results  Labs (all labs ordered are listed, but only abnormal results are displayed) Labs Reviewed  CBC WITH DIFFERENTIAL/PLATELET - Abnormal; Notable for the following components:      Result Value  RBC 3.62 (*)    Hemoglobin 11.5 (*)    MCV 102.5 (*)    RDW 15.6 (*)    Platelets 56 (*)    All  other components within normal limits  COMPREHENSIVE METABOLIC PANEL - Abnormal; Notable for the following components:   Alkaline Phosphatase 31 (*)    GFR calc non Af Amer 53 (*)    All other components within normal limits  URINALYSIS, ROUTINE W REFLEX MICROSCOPIC - Abnormal; Notable for the following components:   Color, Urine STRAW (*)    Ketones, ur 5 (*)    All other components within normal limits  MAGNESIUM  LIPASE, BLOOD  INFLUENZA PANEL BY PCR (TYPE A & B)  PHOSPHORUS  I-STAT TROPONIN, ED    EKG EKG Interpretation  Date/Time:  Saturday April 05 2018 16:42:22 EST Ventricular Rate:  70 PR Interval:    QRS Duration: 154 QT Interval:  480 QTC Calculation: 518 R Axis:   -73 Text Interpretation:  Atrial-ventricular dual-paced rhythm No further analysis attempted due to paced rhythm Confirmed by Cathren Laine ([id number]) on 04/05/2018 4:54:20 PM   Radiology Dg Pelvis 1-2 Views  Result Date: 04/05/2018 CLINICAL DATA:  Bilateral hip pain after fall last night. EXAM: PELVIS - 1-2 VIEW COMPARISON:  None. FINDINGS: The hip joints are unremarkable. However, there appears to be significant widening of the right sacroiliac joint which may be traumatic in etiology. Left sacroiliac joint appear to be unremarkable. Sacrum is not well visualized. IMPRESSION: Significant widening of the right sacroiliac joint is noted which may be traumatic in etiology. CT scan of the pelvis is recommended for further evaluation. Electronically Signed   By: Lupita Raider, M.D.   On: 04/05/2018 17:46   Ct Abdomen Pelvis W Contrast  Result Date: 04/05/2018 CLINICAL DATA:  Weight loss. EXAM: CT ABDOMEN AND PELVIS WITH CONTRAST TECHNIQUE: Multidetector CT imaging of the abdomen and pelvis was performed using the standard protocol following bolus administration of intravenous contrast. CONTRAST:  OMNIPAQUE IOHEXOL 300 MG/ML  SOLN COMPARISON:  None. FINDINGS: Lower chest: No acute abnormality. Hepatobiliary:  No focal liver abnormality is seen. No gallstones, gallbladder wall thickening, or biliary dilatation. Pancreas: Unremarkable. No pancreatic ductal dilatation or surrounding inflammatory changes. Spleen: Normal in size without focal abnormality. Adrenals/Urinary Tract: Adrenal glands appear normal. Bilateral renal cortical scarring is noted. No hydronephrosis or renal obstruction is noted. No renal or ureteral calculi are noted. Urinary bladder is unremarkable. Stomach/Bowel: Stomach is within normal limits. Appendix appears normal. No evidence of bowel wall thickening, distention, or inflammatory changes. Vascular/Lymphatic: Aortic atherosclerosis. No enlarged abdominal or pelvic lymph nodes. Reproductive: Status post hysterectomy. No adnexal masses. Other: No abdominal wall hernia or abnormality. No abdominopelvic ascites. Musculoskeletal: No acute or significant osseous findings. IMPRESSION: No acute abnormality seen in the abdomen or pelvis. Electronically Signed   By: Lupita Raider, M.D.   On: 04/05/2018 20:36   Dg Chest Portable 1 View  Result Date: 04/05/2018 CLINICAL DATA:  Patient status post fall.  Shortness of breath EXAM: PORTABLE CHEST 1 VIEW COMPARISON:  Chest radiograph 04/01/2018 FINDINGS: Multi lead pacer apparatus overlies the left hemithorax, leads are stable in position. With monitoring leads overlie the patient. Stable cardiomegaly. No consolidative pulmonary opacities. No pleural effusion or pneumothorax. Left basilar atelectasis. IMPRESSION: No acute cardiopulmonary process.  Cardiomegaly. Electronically Signed   By: Annia Belt M.D.   On: 04/05/2018 17:43    Procedures Procedures (including critical care time)  Medications Ordered in ED  Medications  sodium chloride 0.9 % bolus 500 mL (0 mLs Intravenous Stopped 04/05/18 2217)  iohexol (OMNIPAQUE) 300 MG/ML solution 100 mL (100 mLs Intravenous Contrast Given 04/05/18 2012)     Initial Impression / Assessment and Plan / ED Course   I have reviewed the triage vital signs and the nursing notes.  Pertinent labs & imaging results that were available during my care of the patient were reviewed by me and considered in my medical decision making (see chart for details).     MDM:  Imaging: Pelvis film shows significant widening of the right SI joint which may be traumatic.  Chest x-ray with cardiomegaly but otherwise unremarkable.  CT abdomen pelvis shows no acute abnormality.  ED Provider Interpretation of EKG: EKG shows what appears to be an AV paced rhythm with left axis deviation and no ST segment elevation to meet Scarborough's or criteria for STEMI.T wave inversions in 1 and aVL were seen on prior EKGs.  Labs: UA negative, flu negative, troponin 0.03, CBC unremarkable with exception of platelets 56, CMP normal, mag normal, lipase normal, Foss 4.0  On initial evaluation, patient appears chronically ill but stable. Afebrile and hemodynamically stable. Alert and oriented x4, pleasant, and cooperative.  Presents with sore throat decreased appetite with mechanical fall onto her bottom earlier today as detailed above.  On exam, patient has no focal abnormality with exception of minimal posterior pharyngeal erythema.  No cobblestoning or blisters.  No evidence for RPA or PTA no evidence for tonsillar abscess.  No lymphadenopathy.  Formed motion of the head neck without difficulty.  No evidence for Ludwig's angina or cellulitis.  Lungs are clear bilaterally and heart sounds are normal.  EKG as above with paced rhythm with no ischemic changes.  Troponin negative.  Chest x-ray normal.  No evidence for volume overload.  Very low suspicion for PE.  Labs are unremarkable with exception of mild thrombocytopenia as detailed above.  Given her mild posterior oropharyngeal erythema and sore throat with thrombocytopenia no evidence for active bleeding or petechiae on exam and normal renal function/hemoglobin suspect likely viral syndrome with  possible viral myelosuppression.  No evidence for aplastic crisis.  No evidence for bleed.  No evidence for TTP/HUS.  No recent heparin use.   No evidence for trauma although pelvis film was ordered as a screening film given her fall.  Nontender over the SI joint although x-ray did show possible widening.  CT abdomen pelvis with contrast ordered to further evaluate this as well as her decreased appetite.  This was a normal study.  Lipase also normal with low suspicion for pancreatitis.  Electrolytes normal.  Strength is normal on exam.  She has home health daily at home and her husband helps occasionally.  He is also ill.  Her son helps occasionally as well.  She stated she would like to be admitted but counseled her that there is no indication for admission at this time.  Offered further social work assistance with possible placement in a skilled nursing facility or other acute rehab and she was not interested in this.  Patient spoke to family and had them come and pick her up.  She was discharged home with plan to follow-up with her primary care physician for reevaluation and she was given strict return precautions.  The plan for this patient was discussed with Dr. Denton Lank who voiced agreement and who oversaw evaluation and treatment of this patient.   The patient was fully informed and involved with the  history taking, evaluation, workup including labs/images, and plan. The patient's concerns and questions were addressed to the patient's satisfaction and she expressed agreement with the plan to DC home.    Final Clinical Impressions(s) / ED Diagnoses   Final diagnoses:  Viral pharyngitis  Fall from standing, initial encounter  Thrombocytopenia [facility](HCC)    ED Discharge Orders    None       Daylah Sayavong, Sherryle LisJames F II, MD 04/06/18 Sandi Raveling0025    Steinl, Kevin, MD 04/06/18 [phone]

## 2018-04-05 NOTE — ED Notes (Signed)
Pt to CT at this time.

## 2018-09-02 IMAGING — CR DG CHEST 2V
2 series · 2 of 2 positions shown · non-contrast
Comparison: 07/18/2016

CLINICAL DATA: Sore throat which shortness-of-breath.

EXAM:
CHEST  2 VIEW

[w chest pa]
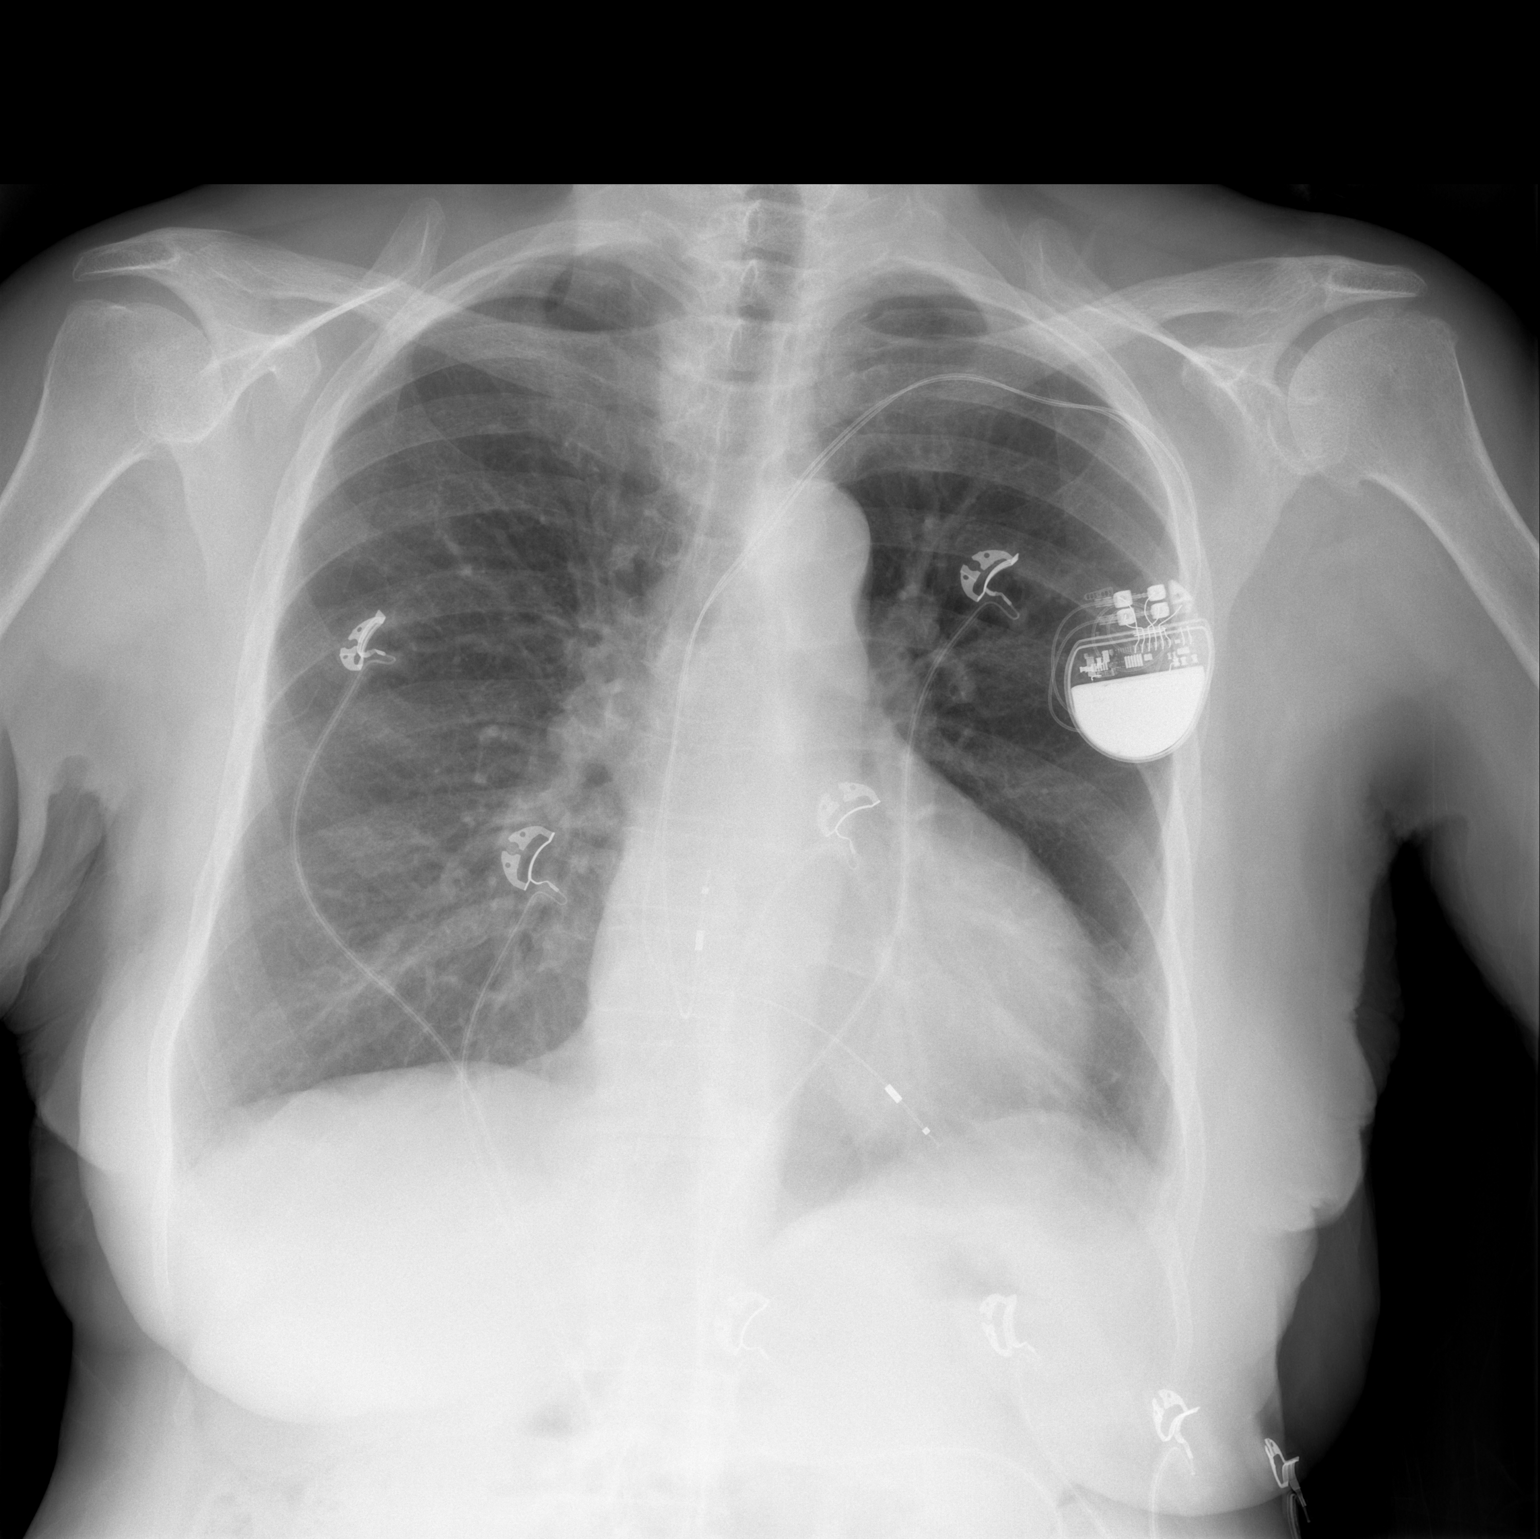

[w chest lat]
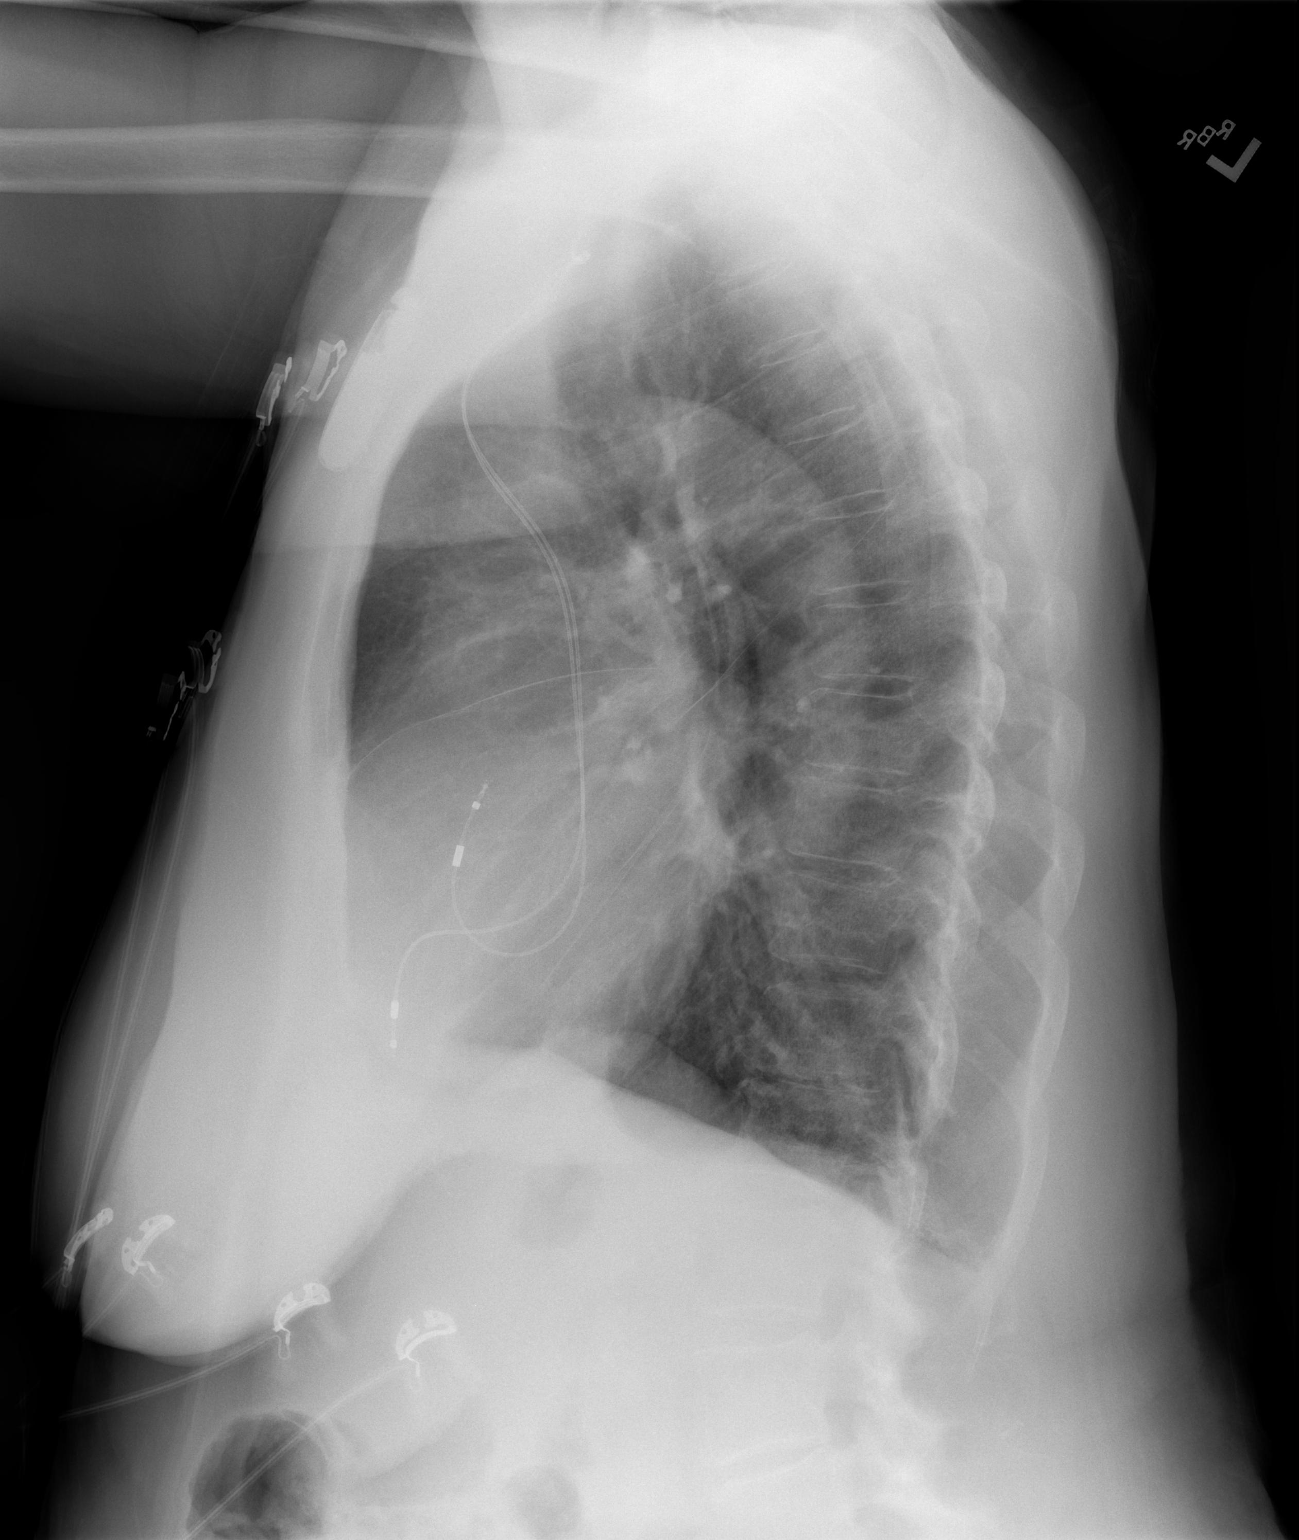

[2 of 2 positions shown; findings below may reference images not displayed]

FINDINGS: Left-sided pacemaker unchanged. Lungs are adequately inflated
without focal consolidation or effusion. Cardiomediastinal
silhouette and remainder the exam is unchanged.
IMPRESSION: No active cardiopulmonary disease.

## 2019-03-10 IMAGING — CR DG CHEST 2V
2 series · 2 of 2 positions shown · non-contrast
Comparison: 05/01/2017

CLINICAL DATA: Short of breath bilateral leg swelling

EXAM:
CHEST - 2 VIEW

[w chest ap]
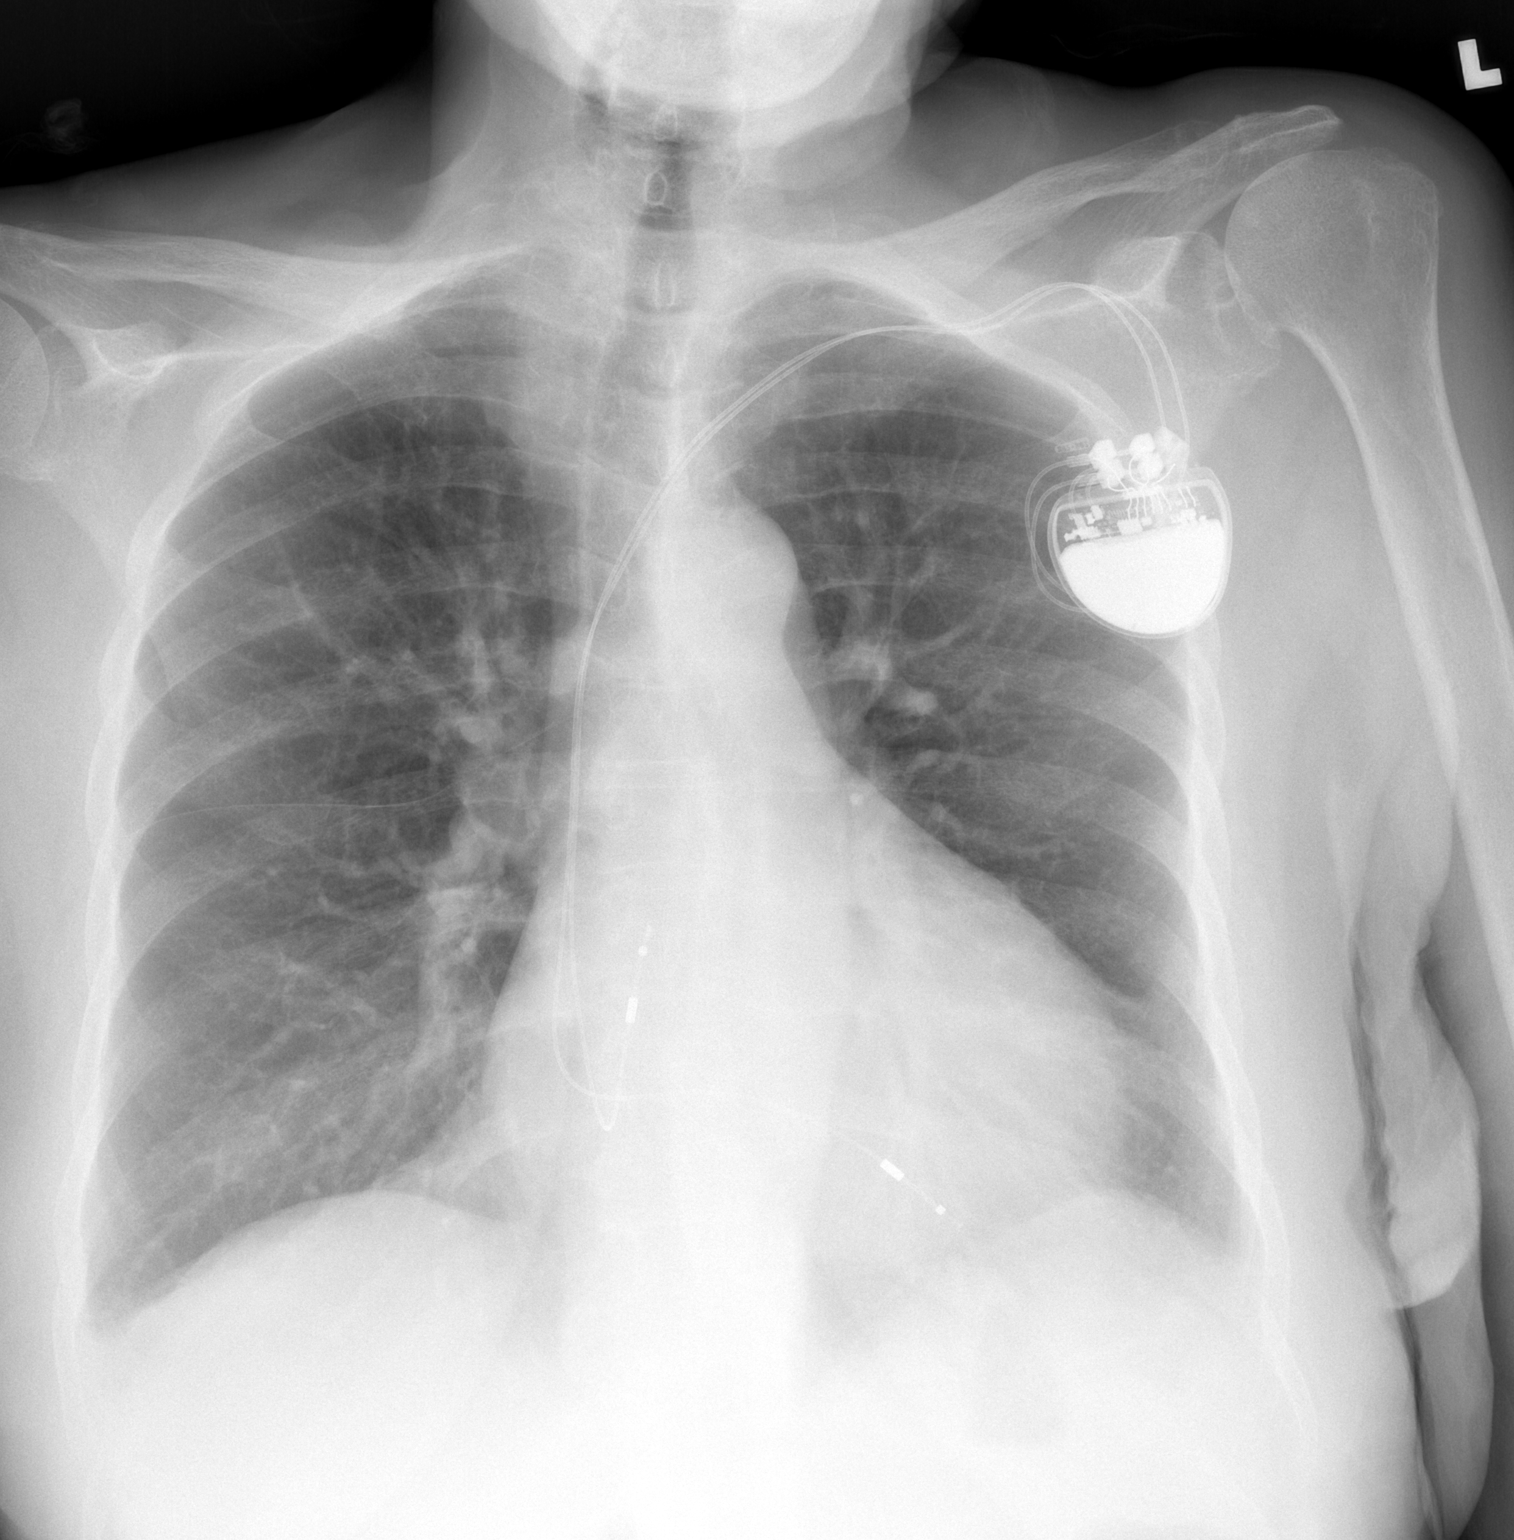

[w chest lat]
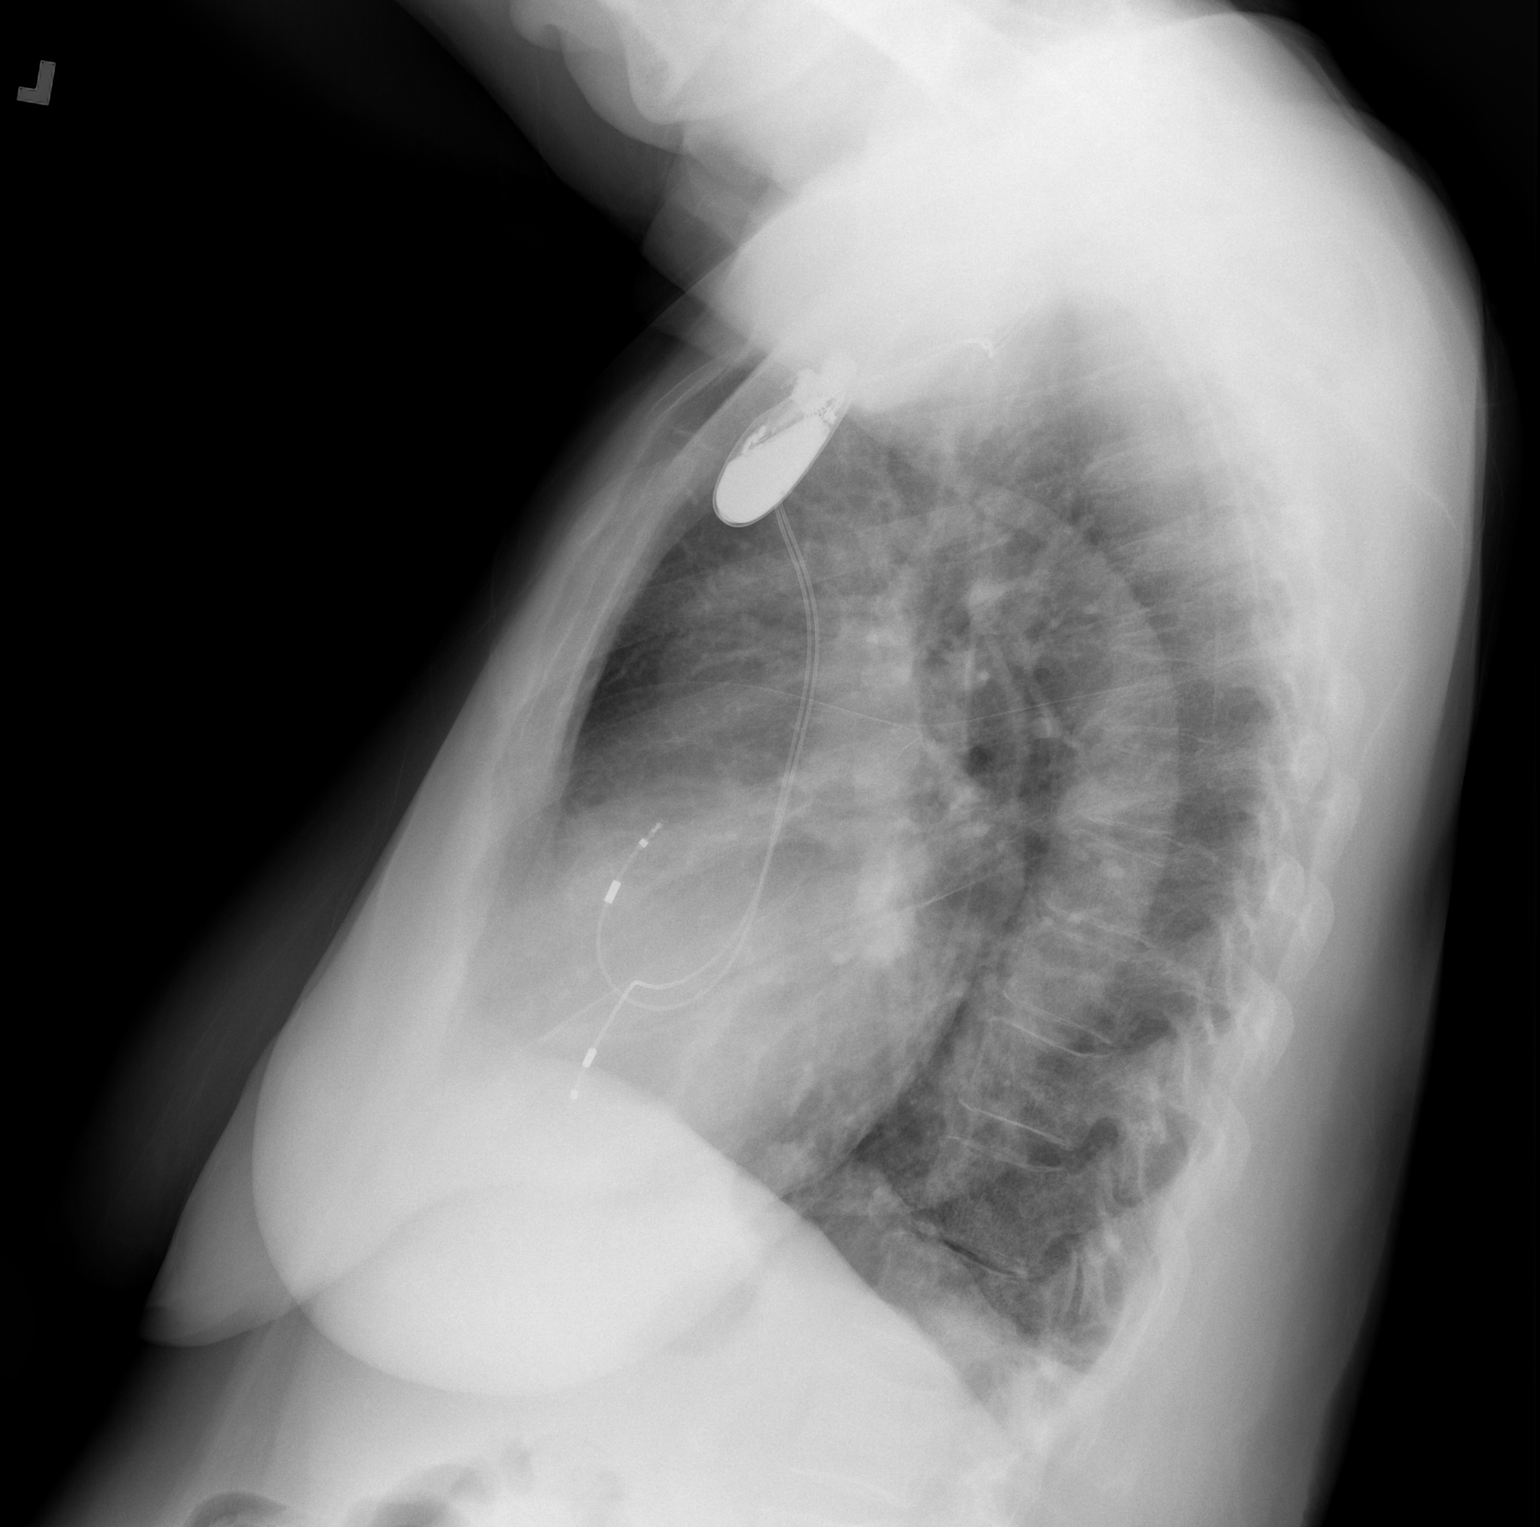

[2 of 2 positions shown; findings below may reference images not displayed]

FINDINGS: Cardiac enlargement without heart failure or edema. No pleural
effusion. Pulmonary hyperinflation without pneumonia. Dual lead
pacemaker unchanged.
IMPRESSION: No active cardiopulmonary disease.

## 2019-04-13 ENCOUNTER — Emergency Department (HOSPITAL_BASED_OUTPATIENT_CLINIC_OR_DEPARTMENT_OTHER)
Admission: EM | Admit: 2019-04-13 | Discharge: 2019-04-13 | Disposition: A | Discharge status: Home / Self Care | Payer: Medicare HMO | Attending: Emergency Medicine | Admitting: Emergency Medicine

## 2019-04-13 ENCOUNTER — Emergency Department (HOSPITAL_BASED_OUTPATIENT_CLINIC_OR_DEPARTMENT_OTHER): Payer: Medicare HMO

## 2019-04-13 ENCOUNTER — Other Ambulatory Visit: Payer: Self-pay

## 2019-04-13 ENCOUNTER — Encounter (HOSPITAL_BASED_OUTPATIENT_CLINIC_OR_DEPARTMENT_OTHER): Payer: Self-pay

## 2019-04-13 DIAGNOSIS — Z7901 Long term (current) use of anticoagulants: Secondary | ICD-10-CM | POA: Insufficient documentation

## 2019-04-13 DIAGNOSIS — I10 Essential (primary) hypertension: Secondary | ICD-10-CM | POA: Diagnosis not present

## 2019-04-13 DIAGNOSIS — Z79899 Other long term (current) drug therapy: Secondary | ICD-10-CM | POA: Insufficient documentation

## 2019-04-13 DIAGNOSIS — Z91040 Latex allergy status: Secondary | ICD-10-CM | POA: Insufficient documentation

## 2019-04-13 DIAGNOSIS — J449 Chronic obstructive pulmonary disease, unspecified: Secondary | ICD-10-CM | POA: Insufficient documentation

## 2019-04-13 DIAGNOSIS — G8929 Other chronic pain: Secondary | ICD-10-CM | POA: Insufficient documentation

## 2019-04-13 DIAGNOSIS — W19XXXA Unspecified fall, initial encounter: Secondary | ICD-10-CM

## 2019-04-13 DIAGNOSIS — Z9181 History of falling: Secondary | ICD-10-CM | POA: Insufficient documentation

## 2019-04-13 DIAGNOSIS — M25561 Pain in right knee: Secondary | ICD-10-CM | POA: Insufficient documentation

## 2019-04-13 DIAGNOSIS — Z950 Presence of cardiac pacemaker: Secondary | ICD-10-CM | POA: Insufficient documentation

## 2019-04-13 HISTORY — DX: Presence of cardiac pacemaker: Z95.0

## 2019-04-13 MED ORDER — ACETAMINOPHEN 500 MG PO TABS
1000.0000 mg | ORAL_TABLET | Freq: Once | ORAL | Status: AC
  Administered 2019-04-13: 1000 mg via ORAL
  Filled 2019-04-13: qty 2

## 2019-04-13 NOTE — ED Triage Notes (Signed)
Pt states she fell ~42month ago-pain to right knee-to triage in w/c-NAD

## 2019-04-13 NOTE — ED Notes (Signed)
ED Provider at bedside. 

## 2019-04-13 NOTE — Discharge Instructions (Addendum)
Take Tylenol as needed for your knee pain.  I have placed you in a knee brace.  I would suggest following up with orthopedics for reevaluation given your pain has been persistent for 1 month.

## 2019-04-13 NOTE — ED Provider Notes (Signed)
[facility] EMERGENCY DEPARTMENT Provider Note   CSN: [id number] Arrival date & time: 04/13/19  1647    History Chief Complaint  Patient presents with  . Fall    Kara Sanders is a 86 y.o. female with has medical history significant for Mobitz type II with pacemaker, COPD, GERD, hiatal hernia, hypertension who presents for evaluation of fall.  Patient states she fell 1 month ago and landed on the anterior aspect of her right knee.  Patient states she has continued pain to her right knee since the fall.  She denies any head, LOC.  Patient states she has generalized weakness.  Lower extremities at baseline.  This is what caused her fall.  Patient states she has been walking with a walker.  She denies any unilateral leg swelling, redness or warmth.  She has not followed with a PCP or orthopedist for this.  Rates pain a 8/10.  Pain located to anterior aspect of left knee.  No fever, chills, nausea, vomiting, chest pain, shortness of breath, dumping, diarrhea, dysuria, decreased range of motion, numbness or tingling.  Denies additional aggravating or alleviating factors.  Lives with family.  History obtained from patient and past medical cards.  No interpreter is used.  HPI     Past Medical History:  Diagnosis Date  . COPD, moderate (HCC)   . GERD (gastroesophageal reflux disease)   . Hiatal hernia   . HTN (hypertension)   . Mobitz type 2 second degree AV block   . Pacemaker     Patient Active Problem List   Diagnosis Date Noted  . Asthmatic bronchitis 08/21/2010  . SLEEP APNEA, OBSTRUCTIVE 03/09/2010  . BRADYCARDIA 10/22/2008  . HYPERTENSION 10/21/2008  . HIATAL HERNIA, HX OF 10/21/2008  . G E R D 02/04/2008    Past Surgical History:  Procedure Laterality Date  . COLONOSCOPY  09/22/02  . ESOPHAGOGASTRODUODENOSCOPY  12/20/98  . KNEE SURGERY     bilateral  . PACEMAKER PLACEMENT       OB History   No obstetric history on file.     No family history on  file.  Social History   Tobacco Use  . Smoking status: Never Smoker  . Smokeless tobacco: Never Used  Substance Use Topics  . Alcohol use: No  . Drug use: Not on file    Home Medications Prior to Admission medications   Medication Sig Start Date End Date Taking? Authorizing Provider  acetaminophen (TYLENOL) 500 MG tablet Take 1,500 mg by mouth 2 (two) times daily as needed for headache (pain).    [provider]  albuterol (PROVENTIL) (2.5 MG/3ML) 0.083% nebulizer solution Take 2.5 mg by nebulization every 6 (six) hours as needed for wheezing.    [provider]  albuterol (VENTOLIN HFA) 108 (90 BASE) MCG/ACT inhaler Inhale 2 puffs into the lungs every 6 (six) hours as needed. Patient taking differently: Inhale 2 puffs into the lungs every 6 (six) hours as needed for wheezing.  10/18/10   Storm Frisk, MD  ALPRAZolam Prudy Feeler) 0.25 MG tablet Take 0.25 mg by mouth See admin instructions. Take one tablet (0.25 mg) by mouth every morning and two tablets (0.5 mg) at night as needed for anxiety/sleep 07/29/10   [provider]  amLODipine (NORVASC) 10 MG tablet Take 10 mg by mouth daily. 03/04/18   [provider]  calcium-vitamin D (OSCAL WITH D) 500-200 MG-UNIT tablet Take 2 tablets by mouth daily.    [provider]  carvedilol (  COREG) 25 MG tablet Take 25 mg by mouth 2 (two) times daily with a meal.    [provider]  cloNIDine (CATAPRES) 0.1 MG tablet Take 0.1 mg by mouth 2 (two) times daily. 01/07/18   [provider]  cycloSPORINE (RESTASIS) 0.05 % ophthalmic emulsion Place 1 drop into both eyes 2 (two) times daily.    [provider]  dextromethorphan-guaiFENesin (MUCINEX DM) 30-600 MG per 12 hr tablet Take 1 tablet by mouth every 12 (twelve) hours.      [provider]  diclofenac sodium (VOLTAREN) 1 % GEL Apply topically.    [provider]  diphenhydrAMINE (BENADRYL) 25 MG tablet Take 1 tablet  (25 mg total) by mouth every 6 (six) hours as needed for itching or allergies. 05/08/17   Domenic Moras, PA-C  fluticasone (FLOVENT HFA) 110 MCG/ACT inhaler Inhale 1 puff into the lungs 2 (two) times daily.    [provider]  furosemide (LASIX) 20 MG tablet Take 20 mg by mouth 2 (two) times daily.     [provider]  isosorbide-hydrALAZINE (BIDIL) 20-37.5 MG tablet Take 1 tablet by mouth 2 (two) times daily.    [provider]  levocetirizine (XYZAL) 5 MG tablet Take 5 mg by mouth every evening.    [provider]  losartan (COZAAR) 100 MG tablet Take 100 mg by mouth daily.      [provider]  magnesium oxide (MAG-OX) 400 MG tablet Take 400 mg by mouth daily.    [provider]  mometasone-formoterol (DULERA) 100-5 MCG/ACT AERO Inhale 2 puffs into the lungs 2 (two) times daily. 11/14/10   Elsie Stain, MD  montelukast (SINGULAIR) 10 MG tablet Take 10 mg by mouth at bedtime.    [provider]  pantoprazole (PROTONIX) 40 MG tablet Take 40 mg by mouth daily before breakfast.     [provider]  potassium chloride SA (K-DUR,KLOR-CON) 20 MEQ tablet Take 20 mEq by mouth 2 (two) times daily.      [provider]  Propylene Glycol (SYSTANE BALANCE) 0.6 % SOLN Place 1 drop into both eyes.    [provider]  Psyllium (METAMUCIL) 30.9 % POWD Take 1 Scoop by mouth 2 (two) times daily as needed (constipation).     [provider]  sertraline (ZOLOFT) 100 MG tablet Take 150 mg by mouth daily. 03/30/18   [provider]  warfarin (COUMADIN) 5 MG tablet Take 5 mg by mouth as directed.      [provider]    Allergies    Ciprofloxacin, Codeine, Latex, Penicillins, Sulfonamide derivatives, and Tiotropium bromide monohydrate  Review of Systems   Review of Systems  Constitutional: Negative.   HENT: Negative.   Respiratory: Negative.   Cardiovascular: Negative.   Gastrointestinal:  Negative.   Genitourinary: Negative.   Musculoskeletal: Positive for gait problem. Negative for arthralgias, back pain, joint swelling, myalgias, neck pain and neck stiffness.       Right knee pain  Skin: Negative.   All other systems reviewed and are negative.   Physical Exam Updated Vital Signs BP 130/62   Pulse 70   Temp 97.8 F (36.6 C) (Oral)   Resp 20   SpO2 93%   Physical Exam Vitals and nursing note reviewed.  Constitutional:      General: She is not in acute distress.    Appearance: She is well-developed. She is not toxic-appearing.  HENT:     Head: Normocephalic and atraumatic.  Nose: Nose normal.     Mouth/Throat:     Mouth: Mucous membranes are moist.     Pharynx: Oropharynx is clear.  Eyes:     Pupils: Pupils are equal, round, and reactive to light.  Cardiovascular:     Rate and Rhythm: Normal rate.     Pulses: Normal pulses.          Dorsalis pedis pulses are 2+ on the right side and 2+ on the left side.       Posterior tibial pulses are 2+ on the right side and 2+ on the left side.     Heart sounds: Normal heart sounds.  Pulmonary:     Effort: Pulmonary effort is normal. No respiratory distress.     Breath sounds: Normal breath sounds.  Abdominal:     General: Bowel sounds are normal. There is no distension.  Musculoskeletal:        General: Normal range of motion.     Cervical back: Normal range of motion.     Right hip: Normal.     Left hip: Normal.     Right upper leg: Tenderness present. No swelling, edema, deformity, lacerations or bony tenderness.     Left upper leg: Normal.     Right knee: No swelling, deformity, effusion, erythema, ecchymosis, lacerations, bony tenderness or crepitus. Normal range of motion. Tenderness present over the patellar tendon. Normal alignment, normal meniscus and normal patellar mobility.     Instability Tests: Negative medial McMurray test and negative lateral McMurray test.     Left knee: Normal.     Instability  Tests: Negative medial McMurray test and negative lateral McMurray test.     Right lower leg: Normal.     Left lower leg: Normal.       Legs:     Comments: Tenderness to anterior superior and inferior aspect of right knee.  Old midline incision from prior surgery.  Able to straight leg raise without difficulty.  Able to flex and extend.  Negative anterior drawer, varus, valgus stress.  No obvious effusion, edema, erythema or warmth.  Calves soft, nontender bilaterally.  No tenderness to right popliteal fossa.  Feet:     Right foot:     Toenail Condition: Right toenails are abnormally thick and long.     Left foot:     Toenail Condition: Left toenails are abnormally thick and long.  Skin:    General: Skin is warm and dry.     Capillary Refill: Capillary refill takes less than 2 seconds.     Comments: Brisk cap refill. No edema, erythema, warmth  Neurological:     Mental Status: She is alert.     Comments: Intact sensation to bilateral lower extremities. Ambulatory with walker. 4+/5 strength to extension to bil knees      ED Results / Procedures / Treatments   Labs (all labs ordered are listed, but only abnormal results are displayed) Labs Reviewed - No data to display  EKG None  Radiology DG Tibia/Fibula Right  Result Date: 04/13/2019 CLINICAL DATA:  Right lower leg pain since a fall 1 month ago. Initial encounter. EXAM: RIGHT TIBIA AND FIBULA - 2 VIEW COMPARISON:  None. FINDINGS: Right knee replacement noted. There is no evidence of fracture or other focal bone lesions. Soft tissues are unremarkable. IMPRESSION: No acute abnormality. Electronically Signed   By: Drusilla Kanner M.D.   On: 04/13/2019 18:45   DG Knee Complete 4 Views Right  Result Date: 04/13/2019  CLINICAL DATA:  Fall, pain EXAM: RIGHT KNEE - COMPLETE 4+ VIEW COMPARISON:  02/06/2018 FINDINGS: Osteopenia. Status post right knee total arthroplasty. No evidence of perihardware fracture or loosening. Soft tissues are  unremarkable. IMPRESSION: Osteopenia. Status post right knee total arthroplasty without evidence of fracture or loosening. Electronically Signed   By: Lauralyn Primes M.D.   On: 04/13/2019 18:48   DG Femur Min 2 Views Right  Result Date: 04/13/2019 CLINICAL DATA:  Fall, pain EXAM: RIGHT FEMUR 2 VIEWS COMPARISON:  Pelvic radiographs, 10/19/2010 FINDINGS: Osteopenia. No displaced fracture or dislocation of the right femur. Status post right knee total arthroplasty. There is a benign enchondroma or other lesion of proximal right femoral neck, generally unchanged compared to prior examination dated 10/19/2010. IMPRESSION: Osteopenia. No displaced fracture or dislocation of the right femur. Electronically Signed   By: Lauralyn Primes M.D.   On: 04/13/2019 18:47    Procedures Procedures (including critical care time)  Medications Ordered in ED Medications  acetaminophen (TYLENOL) tablet 1,000 mg (1,000 mg Oral Given 04/13/19 1827)    ED Course  I have reviewed the triage vital signs and the nursing notes.  Pertinent labs & imaging results that were available during my care of the patient were reviewed by me and considered in my medical decision making (see chart for details).  86 year old female presents for evaluation of right knee pain after fall 1 month ago.  Mechanical fall she laid on the anterior aspect of her knee.  She walks with a walker at baseline.  She lives with family.  Has some generalized chronic weakness to her lower extremities which causes frequent falls.  Has not been seen by PCP or orthopedist for this.  No edema, erythema or warmth.  No large effusion.  She is able to straight leg raise without difficulty.  She has good flexion and extension however has some mild decreased strength with flexion of bilateral knees.  Maneuvers, for stress.  Negative anterior drawer.  Compartments soft.  No unilateral leg swelling, redness or warmth.  Plan for x-rays.  Plain films without abnormality.  No  evidence of edema, erythema or warmth suggest a DVT.  She is on Coumadin.  Her compartments are soft.  Low suspicion for myositis.  Her pelvis is stable, nontender.  No shortening or rotation of leg and she is ambulatory.  I low suspicion for occult fracture radiation of pain from her back given pain located anterior to her knee.  We will have her follow with orthopedics given her follows 1 month ago and she still is in persistent pain.  Given she is anticoagulated we will have her hold off on NSAIDs however we will have her take Tylenol, ice, elevate the extremity.  We will also have her take some Tylenol for her pain.  She has no effusion on exam.  Low suspicion for septic joint, gout, hemarthrosis. She does have osteoarthritis on xray.  Discussed with attending Dr. Donnald Garre who is in agreement with above treatment, plan and disposition.  The patient has been appropriately medically screened and/or stabilized in the ED. I have low suspicion for any other emergent medical condition which would require further screening, evaluation or treatment in the ED or require inpatient management.  Patient is hemodynamically stable and in no acute distress.  Patient able to ambulate in department prior to ED.  Evaluation does not show acute pathology that would require ongoing or additional emergent interventions while in the emergency department or further inpatient treatment.  I  have discussed the diagnosis with the patient and answered all questions.  Pain is been managed while in the emergency department and patient has no further complaints prior to discharge.  Patient is comfortable with plan discussed in room and is stable for discharge at this time.  I have discussed strict return precautions for returning to the emergency department.  Patient was encouraged to follow-up with PCP/specialist refer to at discharge.    MDM Rules/Calculators/A&P                       Final Clinical Impression(s) / ED  Diagnoses Final diagnoses:  Fall, initial encounter  Chronic pain of right knee    Rx / DC Orders ED Discharge Orders    None       Ahnna Dungan A, PA-C 04/13/19 2008    Arby Barrette, MD 04/22/19 [phone]

## 2020-01-17 IMAGING — DX DG CHEST 2V
2 series · 2 of 2 positions shown · non-contrast
Comparison: 06/04/2017.

CLINICAL DATA: Chronic generalized pain. Shortness of breath.

EXAM:
CHEST - 2 VIEW

[chest lat]
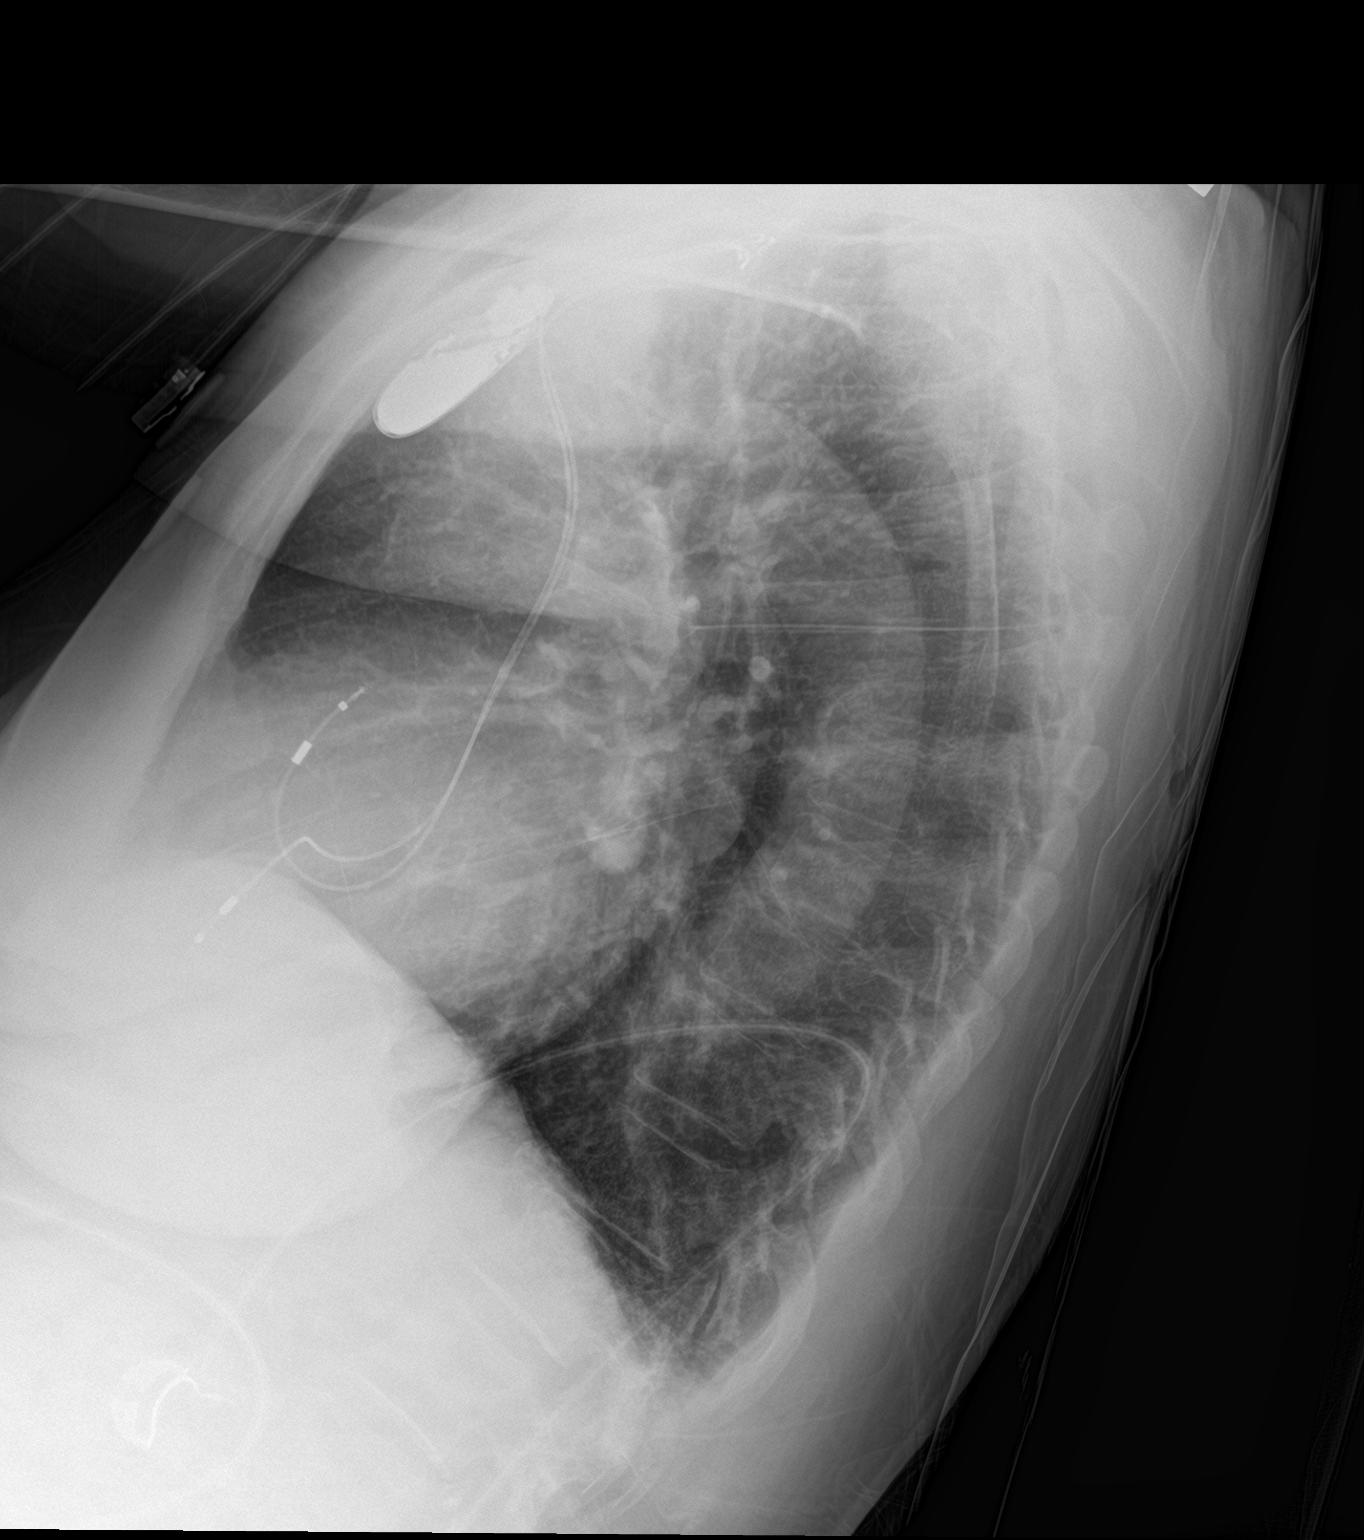

[chest ap strecther]
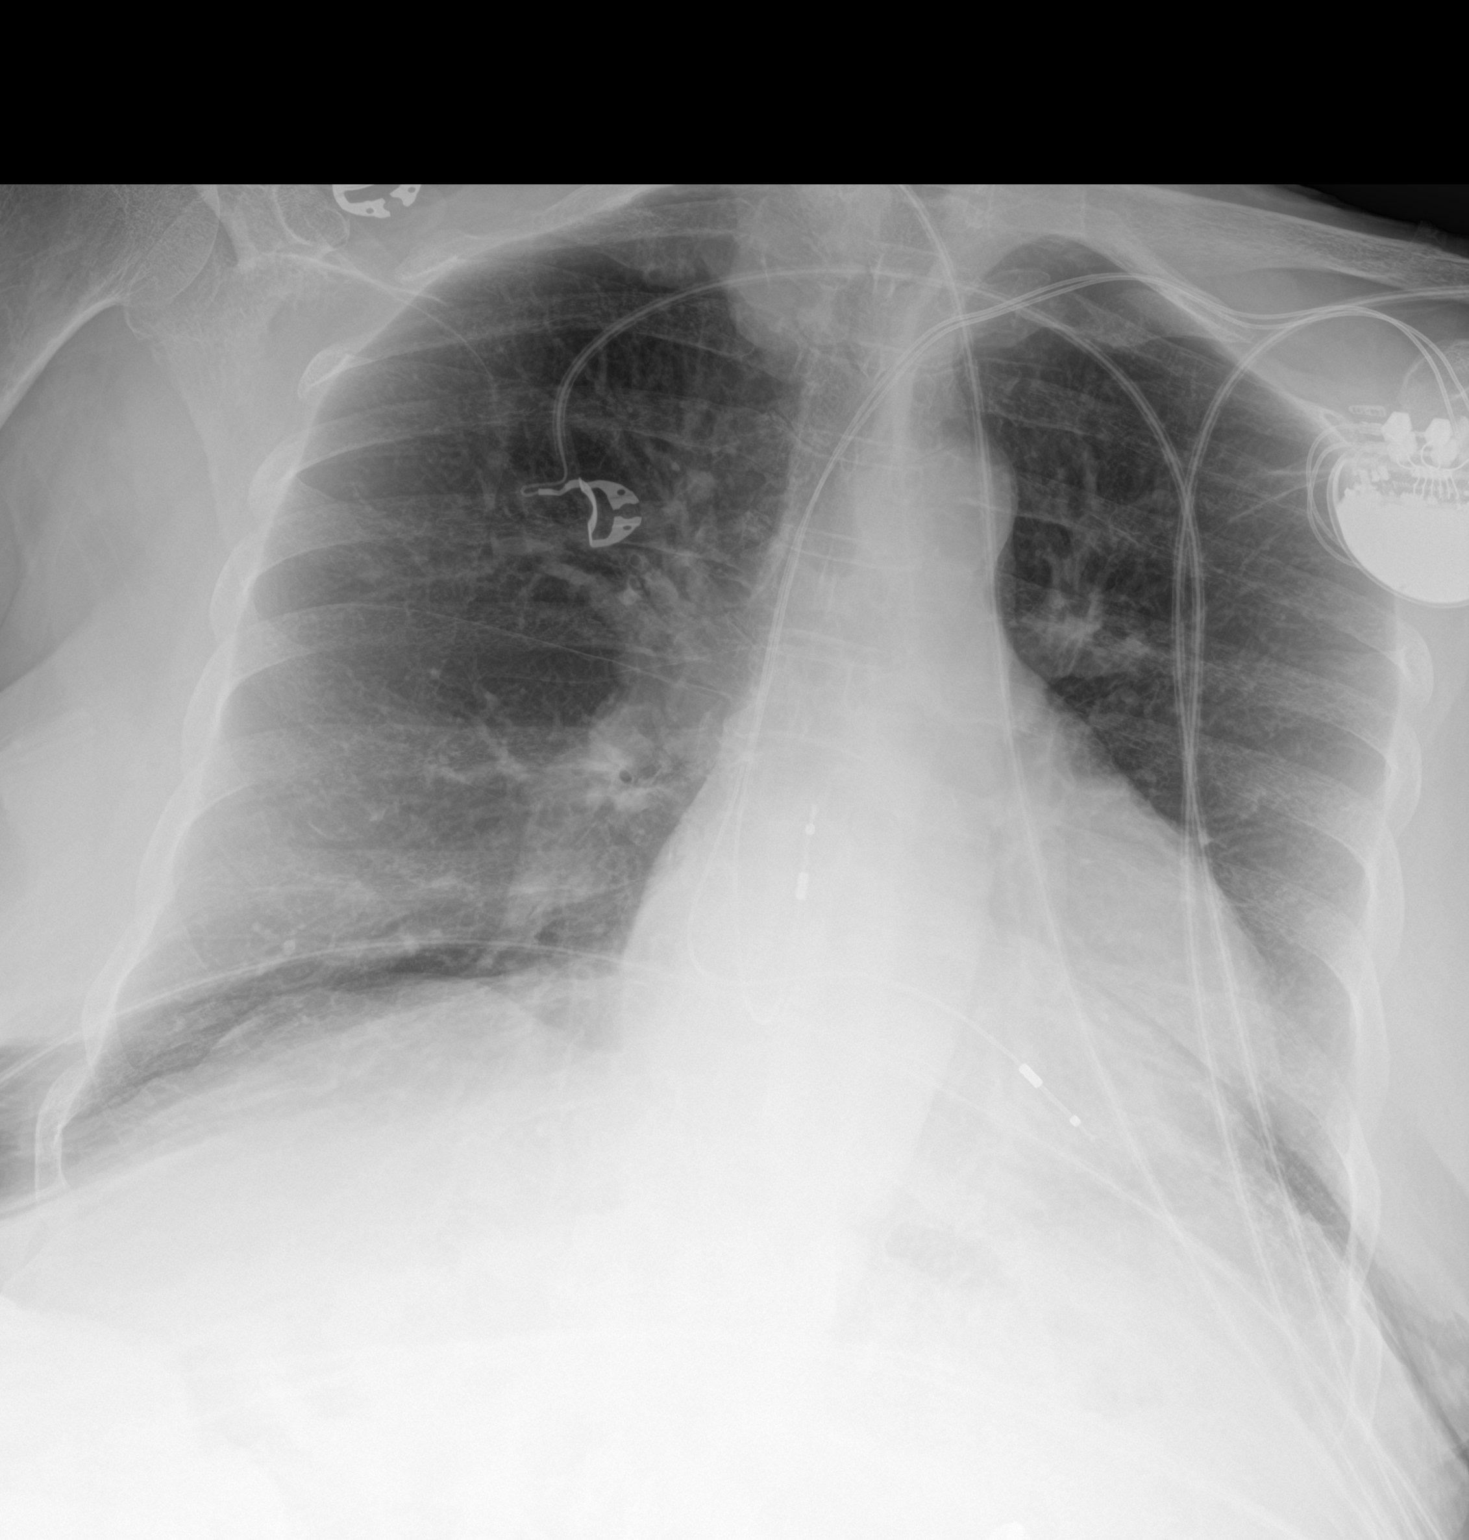

[2 of 2 positions shown; findings below may reference images not displayed]

FINDINGS: Stable enlarged cardiac silhouette and left subclavian pacemaker
leads. The pulmonary vasculature remains prominent and the
interstitial markings remain minimally prominent. No visible pleural
fluid. Unremarkable bones.
IMPRESSION: Stable cardiomegaly, pulmonary vascular congestion and minimal
chronic interstitial lung disease.

## 2020-09-03 ENCOUNTER — Emergency Department (HOSPITAL_BASED_OUTPATIENT_CLINIC_OR_DEPARTMENT_OTHER)
Admission: EM | Admit: 2020-09-03 | Discharge: 2020-09-03 | Disposition: A | Discharge status: Home / Self Care | Payer: Medicare HMO | Attending: Emergency Medicine | Admitting: Emergency Medicine

## 2020-09-03 ENCOUNTER — Other Ambulatory Visit: Payer: Self-pay

## 2020-09-03 ENCOUNTER — Encounter (HOSPITAL_BASED_OUTPATIENT_CLINIC_OR_DEPARTMENT_OTHER): Payer: Self-pay | Admitting: Emergency Medicine

## 2020-09-03 ENCOUNTER — Emergency Department (HOSPITAL_BASED_OUTPATIENT_CLINIC_OR_DEPARTMENT_OTHER): Payer: Medicare HMO

## 2020-09-03 DIAGNOSIS — R5383 Other fatigue: Secondary | ICD-10-CM

## 2020-09-03 DIAGNOSIS — J449 Chronic obstructive pulmonary disease, unspecified: Secondary | ICD-10-CM | POA: Diagnosis not present

## 2020-09-03 DIAGNOSIS — R059 Cough, unspecified: Secondary | ICD-10-CM

## 2020-09-03 DIAGNOSIS — N3000 Acute cystitis without hematuria: Secondary | ICD-10-CM | POA: Insufficient documentation

## 2020-09-03 DIAGNOSIS — U071 COVID-19: Secondary | ICD-10-CM | POA: Insufficient documentation

## 2020-09-03 DIAGNOSIS — Z7951 Long term (current) use of inhaled steroids: Secondary | ICD-10-CM | POA: Insufficient documentation

## 2020-09-03 DIAGNOSIS — Z950 Presence of cardiac pacemaker: Secondary | ICD-10-CM | POA: Diagnosis not present

## 2020-09-03 DIAGNOSIS — R600 Localized edema: Secondary | ICD-10-CM | POA: Diagnosis not present

## 2020-09-03 DIAGNOSIS — Z79899 Other long term (current) drug therapy: Secondary | ICD-10-CM | POA: Diagnosis not present

## 2020-09-03 DIAGNOSIS — I10 Essential (primary) hypertension: Secondary | ICD-10-CM | POA: Diagnosis not present

## 2020-09-03 DIAGNOSIS — Z7901 Long term (current) use of anticoagulants: Secondary | ICD-10-CM | POA: Insufficient documentation

## 2020-09-03 DIAGNOSIS — Z91040 Latex allergy status: Secondary | ICD-10-CM | POA: Insufficient documentation

## 2020-09-03 DIAGNOSIS — R0602 Shortness of breath: Secondary | ICD-10-CM | POA: Diagnosis present

## 2020-09-03 LAB — CBC WITH DIFFERENTIAL/PLATELET
Abs Immature Granulocytes: 0.01 10*3/uL (ref 0.00–0.07)
Basophils Absolute: 0 10*3/uL (ref 0.0–0.1)
Basophils Relative: 0 %
Eosinophils Absolute: 0.2 10*3/uL (ref 0.0–0.5)
Eosinophils Relative: 4 %
HCT: 34.6 % — ABNORMAL LOW (ref 36.0–46.0)
Hemoglobin: 11.2 g/dL — ABNORMAL LOW (ref 12.0–15.0)
Immature Granulocytes: 0 %
Lymphocytes Relative: 38 %
Lymphs Abs: 1.9 10*3/uL (ref 0.7–4.0)
MCH: 31.5 pg (ref 26.0–34.0)
MCHC: 32.4 g/dL (ref 30.0–36.0)
MCV: 97.5 fL (ref 80.0–100.0)
Monocytes Absolute: 0.7 10*3/uL (ref 0.1–1.0)
Monocytes Relative: 14 %
Neutro Abs: 2.1 10*3/uL (ref 1.7–7.7)
Neutrophils Relative %: 44 %
Platelets: 154 10*3/uL (ref 150–400)
RBC: 3.55 MIL/uL — ABNORMAL LOW (ref 3.87–5.11)
RDW: 14.1 % (ref 11.5–15.5)
WBC: 4.9 10*3/uL (ref 4.0–10.5)
nRBC: 0 % (ref 0.0–0.2)

## 2020-09-03 LAB — COMPREHENSIVE METABOLIC PANEL
ALT: 11 U/L (ref 0–44)
AST: 19 U/L (ref 15–41)
Albumin: 3.7 g/dL (ref 3.5–5.0)
Alkaline Phosphatase: 47 U/L (ref 38–126)
Anion gap: 6 (ref 5–15)
BUN: 20 mg/dL (ref 8–23)
CO2: 27 mmol/L (ref 22–32)
Calcium: 8.9 mg/dL (ref 8.9–10.3)
Chloride: 103 mmol/L (ref 98–111)
Creatinine, Ser: 1.43 mg/dL — ABNORMAL HIGH (ref 0.44–1.00)
GFR, Estimated: 35 mL/min — ABNORMAL LOW (ref >60)
Glucose, Bld: 98 mg/dL (ref 70–99)
Potassium: 4 mmol/L (ref 3.5–5.1)
Sodium: 136 mmol/L (ref 135–145)
Total Bilirubin: 0.4 mg/dL (ref 0.3–1.2)
Total Protein: 7.5 g/dL (ref 6.5–8.1)

## 2020-09-03 LAB — RESP PANEL BY RT-PCR (FLU A&B, COVID) ARPGX2
Influenza A by PCR: NEGATIVE
Influenza B by PCR: NEGATIVE
SARS Coronavirus 2 by RT PCR: POSITIVE — AB

## 2020-09-03 LAB — URINALYSIS, ROUTINE W REFLEX MICROSCOPIC
Bilirubin Urine: NEGATIVE
Glucose, UA: NEGATIVE mg/dL
Hgb urine dipstick: NEGATIVE
Ketones, ur: NEGATIVE mg/dL
Nitrite: NEGATIVE
Protein, ur: NEGATIVE mg/dL
Specific Gravity, Urine: <1.005 — ABNORMAL LOW (ref 1.005–1.030)
pH: 6 (ref 5.0–8.0)

## 2020-09-03 LAB — URINALYSIS, MICROSCOPIC (REFLEX)

## 2020-09-03 LAB — PROTIME-INR
INR: 1 (ref 0.8–1.2)
Prothrombin Time: 13.4 seconds (ref 11.4–15.2)

## 2020-09-03 LAB — BRAIN NATRIURETIC PEPTIDE: B Natriuretic Peptide: 187 pg/mL — ABNORMAL HIGH (ref 0.0–100.0)

## 2020-09-03 LAB — LACTIC ACID, PLASMA: Lactic Acid, Venous: 1.3 mmol/L (ref 0.5–1.9)

## 2020-09-03 MED ORDER — ALBUTEROL SULFATE HFA 108 (90 BASE) MCG/ACT IN AERS
2.0000 | INHALATION_SPRAY | Freq: Once | RESPIRATORY_TRACT | Status: AC
  Administered 2020-09-03: 2 via RESPIRATORY_TRACT
  Filled 2020-09-03: qty 6.7

## 2020-09-03 MED ORDER — SODIUM CHLORIDE 0.9 % IV BOLUS
500.0000 mL | Freq: Once | INTRAVENOUS | Status: AC
  Administered 2020-09-03: 500 mL via INTRAVENOUS

## 2020-09-03 MED ORDER — BEBTELOVIMAB 175 MG/2 ML IV (EUA)
175.0000 mg | Freq: Once | INTRAMUSCULAR | Status: AC
  Administered 2020-09-03: 175 mg via INTRAVENOUS
  Filled 2020-09-03: qty 2

## 2020-09-03 MED ORDER — FOSFOMYCIN TROMETHAMINE 3 G PO PACK
3.0000 g | PACK | Freq: Once | ORAL | Status: AC
  Administered 2020-09-03: 3 g via ORAL
  Filled 2020-09-03: qty 3

## 2020-09-03 NOTE — ED Notes (Signed)
ED Provider at bedside. 

## 2020-09-03 NOTE — Discharge Instructions (Addendum)
Your history, exam, work-up today confirmed you do have COVID-19 likely causing the fatigue, malaise, cough, diarrhea, and had occasional shortness of breath.  Your oxygen saturations were reassuring during the entirety of your stay as well as with movement.  Due to the COVID-19 infection and your other risk factors, pharmacy recommended the infusion therapy which you received.  It is a single treatment infusion therapy.  We also gave you a single treatment antibiotic for the urinary tract infection we also discovered.  Please follow-up with your primary doctor and rest and stay hydrated.  If any symptoms change or worsen acutely, please return to the nearest emergency department.

## 2020-09-03 NOTE — ED Triage Notes (Signed)
Pt arrives pov with driver, reports "Im sick". Daughter tested positive for Covid today. Pt c/o shob, denies fever. EKG performed d/t report of shob

## 2020-09-03 NOTE — ED Provider Notes (Signed)
[facility] EMERGENCY DEPARTMENT Provider Note   CSN: [id number] Arrival date & time: 09/03/20  1413     History Chief Complaint  Patient presents with   Shortness of Breath    Kara Sanders is a 87 y.o. female.  The history is provided by the patient and medical records. No language interpreter was used.  Shortness of Breath Severity:  Moderate Onset quality:  Gradual Duration:  2 days Timing:  Constant Progression:  Waxing and waning Chronicity:  New Context: URI   Relieved by:  Nothing Worsened by:  Coughing Ineffective treatments:  None tried Associated symptoms: cough, sputum production and wheezing   Associated symptoms: no abdominal pain, no chest pain, no diaphoresis, no fever, no headaches, no hemoptysis, no neck pain, no rash and no vomiting       Past Medical History:  Diagnosis Date   COPD, moderate (HCC)    GERD (gastroesophageal reflux disease)    Hiatal hernia    HTN (hypertension)    Mobitz type 2 second degree AV block    Pacemaker     Patient Active Problem List   Diagnosis Date Noted   Asthmatic bronchitis 08/21/2010   SLEEP APNEA, OBSTRUCTIVE 03/09/2010   BRADYCARDIA 10/22/2008   HYPERTENSION 10/21/2008   HIATAL HERNIA, HX OF 10/21/2008   G E R D 02/04/2008    Past Surgical History:  Procedure Laterality Date   COLONOSCOPY  09/22/02   ESOPHAGOGASTRODUODENOSCOPY  12/20/98   KNEE SURGERY     bilateral   PACEMAKER PLACEMENT       OB History   No obstetric history on file.     History reviewed. No pertinent family history.  Social History   Tobacco Use   Smoking status: Never   Smokeless tobacco: Never  Vaping Use   Vaping Use: Never used  Substance Use Topics   Alcohol use: No    Home Medications Prior to Admission medications   Medication Sig Start Date End Date Taking? Authorizing Provider  acetaminophen (TYLENOL) 500 MG tablet Take 1,500 mg by mouth 2 (two) times daily as needed for headache (pain).     [provider]  albuterol (PROVENTIL) (2.5 MG/3ML) 0.083% nebulizer solution Take 2.5 mg by nebulization every 6 (six) hours as needed for wheezing.    [provider]  albuterol (VENTOLIN HFA) 108 (90 BASE) MCG/ACT inhaler Inhale 2 puffs into the lungs every 6 (six) hours as needed. Patient taking differently: Inhale 2 puffs into the lungs every 6 (six) hours as needed for wheezing.  10/18/10   Storm FriskWright, Patrick E, MD  ALPRAZolam Prudy Feeler(XANAX) 0.25 MG tablet Take 0.25 mg by mouth See admin instructions. Take one tablet (0.25 mg) by mouth every morning and two tablets (0.5 mg) at night as needed for anxiety/sleep 07/29/10   [provider]  amLODipine (NORVASC) 10 MG tablet Take 10 mg by mouth daily. 03/04/18   [provider]  calcium-vitamin D (OSCAL WITH D) 500-200 MG-UNIT tablet Take 2 tablets by mouth daily.    [provider]  carvedilol (COREG) 25 MG tablet Take 25 mg by mouth 2 (two) times daily with a meal.    [provider]  cloNIDine (CATAPRES) 0.1 MG tablet Take 0.1 mg by mouth 2 (two) times daily. 01/07/18   [provider]  cycloSPORINE (RESTASIS) 0.05 % ophthalmic emulsion Place 1 drop into both eyes 2 (two) times daily.    [provider]  dextromethorphan-guaiFENesin (MUCINEX DM) 30-600 MG per 12 hr  tablet Take 1 tablet by mouth every 12 (twelve) hours.      [provider]  diclofenac sodium (VOLTAREN) 1 % GEL Apply topically.    [provider]  diphenhydrAMINE (BENADRYL) 25 MG tablet Take 1 tablet (25 mg total) by mouth every 6 (six) hours as needed for itching or allergies. 05/08/17   Fayrene Helper, PA-C  fluticasone (FLOVENT HFA) 110 MCG/ACT inhaler Inhale 1 puff into the lungs 2 (two) times daily.    [provider]  furosemide (LASIX) 20 MG tablet Take 20 mg by mouth 2 (two) times daily.     [provider]  isosorbide-hydrALAZINE (BIDIL) 20-37.5 MG tablet Take 1 tablet by mouth 2  (two) times daily.    [provider]  levocetirizine (XYZAL) 5 MG tablet Take 5 mg by mouth every evening.    [provider]  losartan (COZAAR) 100 MG tablet Take 100 mg by mouth daily.      [provider]  magnesium oxide (MAG-OX) 400 MG tablet Take 400 mg by mouth daily.    [provider]  mometasone-formoterol (DULERA) 100-5 MCG/ACT AERO Inhale 2 puffs into the lungs 2 (two) times daily. 11/14/10   Storm Frisk, MD  montelukast (SINGULAIR) 10 MG tablet Take 10 mg by mouth at bedtime.    [provider]  pantoprazole (PROTONIX) 40 MG tablet Take 40 mg by mouth daily before breakfast.     [provider]  potassium chloride SA (K-DUR,KLOR-CON) 20 MEQ tablet Take 20 mEq by mouth 2 (two) times daily.      [provider]  Propylene Glycol (SYSTANE BALANCE) 0.6 % SOLN Place 1 drop into both eyes.    [provider]  Psyllium (METAMUCIL) 30.9 % POWD Take 1 Scoop by mouth 2 (two) times daily as needed (constipation).     [provider]  sertraline (ZOLOFT) 100 MG tablet Take 150 mg by mouth daily. 03/30/18   [provider]  warfarin (COUMADIN) 5 MG tablet Take 5 mg by mouth as directed.      [provider]    Allergies    Ciprofloxacin, Codeine, Latex, Penicillins, Sulfonamide derivatives, and Tiotropium bromide monohydrate  Review of Systems   Review of Systems  Constitutional:  Positive for appetite change, chills and fatigue. Negative for diaphoresis and fever.  HENT:  Positive for congestion.   Respiratory:  Positive for cough, sputum production, shortness of breath and wheezing. Negative for hemoptysis, chest tightness and stridor.   Cardiovascular:  Positive for leg swelling (chronic). Negative for chest pain and palpitations.  Gastrointestinal:  Positive for diarrhea. Negative for abdominal pain, constipation, nausea and vomiting.  Genitourinary:  Positive for dysuria. Negative for  flank pain.  Musculoskeletal:  Negative for back pain, neck pain and neck stiffness.  Skin:  Negative for rash and wound.  Neurological:  Negative for dizziness, light-headedness and headaches.  Psychiatric/Behavioral:  Negative for agitation.    Physical Exam Updated Vital Signs BP 99/61 (BP Location: Right Arm)   Pulse 70   Temp 98.2 F (36.8 C) (Oral)   Resp (!) 24   Ht 5\' 5"  (1.651 m)   Wt 77.1 kg   SpO2 95%   BMI 28.29 kg/m   Physical Exam Vitals and nursing note reviewed.  Constitutional:      General: She is not in acute distress.    Appearance: She is well-developed. She is not ill-appearing, toxic-appearing or diaphoretic.  HENT:     Head: Normocephalic  and atraumatic.  Eyes:     Conjunctiva/sclera: Conjunctivae normal.     Pupils: Pupils are equal, round, and reactive to light.  Cardiovascular:     Rate and Rhythm: Normal rate and regular rhythm.     Heart sounds: No murmur heard. Pulmonary:     Effort: Pulmonary effort is normal. Tachypnea present. No respiratory distress.     Breath sounds: Wheezing and rhonchi present.  Chest:     Chest wall: No tenderness.  Abdominal:     Palpations: Abdomen is soft.     Tenderness: There is no abdominal tenderness.  Musculoskeletal:     Cervical back: Neck supple.     Right lower leg: No tenderness. Edema present.     Left lower leg: No tenderness. Edema present.  Skin:    General: Skin is warm and dry.     Capillary Refill: Capillary refill takes less than 2 seconds.     Findings: No erythema.  Neurological:     General: No focal deficit present.     Mental Status: She is alert.  Psychiatric:        Mood and Affect: Mood normal.    ED Results / Procedures / Treatments   Labs (all labs ordered are listed, but only abnormal results are displayed) Labs Reviewed  RESP PANEL BY RT-PCR (FLU A&B, COVID) ARPGX2 - Abnormal; Notable for the following components:      Result Value   SARS Coronavirus 2 by RT PCR  POSITIVE (*)    All other components within normal limits  CBC WITH DIFFERENTIAL/PLATELET - Abnormal; Notable for the following components:   RBC 3.55 (*)    Hemoglobin 11.2 (*)    HCT 34.6 (*)    All other components within normal limits  COMPREHENSIVE METABOLIC PANEL - Abnormal; Notable for the following components:   Creatinine, Ser 1.43 (*)    GFR, Estimated 35 (*)    All other components within normal limits  URINALYSIS, ROUTINE W REFLEX MICROSCOPIC - Abnormal; Notable for the following components:   Color, Urine STRAW (*)    Specific Gravity, Urine <1.005 (*)    Leukocytes,Ua LARGE (*)    All other components within normal limits  BRAIN NATRIURETIC PEPTIDE - Abnormal; Notable for the following components:   B Natriuretic Peptide 187.0 (*)    All other components within normal limits  URINALYSIS, MICROSCOPIC (REFLEX) - Abnormal; Notable for the following components:   Bacteria, UA MANY (*)    All other components within normal limits  URINE CULTURE  LACTIC ACID, PLASMA  PROTIME-INR    EKG EKG Interpretation  Date/Time:  Saturday September 03 2020 14:26:55 EDT Ventricular Rate:  70 PR Interval:  176 QRS Duration: 152 QT Interval:  410 QTC Calculation: 442 R Axis:   -77 Text Interpretation: AV dual-paced rhythm Abnormal ECG When compared to prior, similar paced rhythm, No STEMI Confirmed by Theda Belfast ([id number]) on 09/03/2020 3:14:41 PM  Radiology DG Chest Portable 1 View  Result Date: 09/03/2020 CLINICAL DATA:  Fever, chills, shortness of breath and productive cough. History of COVID-19 exposure. EXAM: PORTABLE CHEST 1 VIEW COMPARISON:  Single-view of the chest 08/07/2020 and 02/10/2020. FINDINGS: Two lead pacing device is unchanged. Lungs appear clear. Heart size is upper normal. No pneumothorax or pleural fluid. No acute or focal bony abnormality. IMPRESSION: No acute disease. Electronically Signed   By: Drusilla Kanner M.D.   On: 09/03/2020 16:06     Procedures Procedures   Medications Ordered in ED  Medications  sodium chloride 0.9 % bolus 500 mL (0 mLs Intravenous Stopped 09/03/20 1640)  albuterol (VENTOLIN HFA) 108 (90 Base) MCG/ACT inhaler 2 puff (2 puffs Inhalation Given by Other 09/03/20 1557)  bebtelovimab EUA injection SOLN 175 mg (175 mg Intravenous Given 09/03/20 1911)  fosfomycin (MONUROL) packet 3 g (3 g Oral Given 09/03/20 1914)    ED Course  I have reviewed the triage vital signs and the nursing notes.  Pertinent labs & imaging results that were available during my care of the patient were reviewed by me and considered in my medical decision making (see chart for details).    MDM Rules/Calculators/A&P                          Kara Sanders is a 87 y.o. female with a past medical history significant for hypertension, COPD, GERD, hiatal hernia, and pacemaker dependence who is on Coumadin therapy who presents with 2 days of fatigue, malaise, diffuse soreness, productive cough, shortness of breath, diarrhea, dysuria and decreased appetite.  Patient is unsure of COVID exposures however nursing reports that patient's daughter was diagnosed with COVID today and they have been spending time together.  Patient reports that she thinks she is vaccinated against COVID.  She reports she is also having dysuria and is concerned she might have a UTI.  She denies any chest pain or abdominal pain at this time but does report having a cough and shortness of breath.  She does report she is having wheezing despite inhaler use.  She denies nausea or vomiting but has no appetite.  She think she is dehydrated and reports he also has some edema in her legs that is not any different.  She reports less energy.  On exam, lungs have wheezing and some faint rhonchi but there was no significant crackles.  Abdomen and chest were nontender.  Symmetric pulses in upper and lower extremities.  Patient's oxygen saturations were between 92 and 99 on room air at  rest.  We will ambulate to see if she gets hypoxic with ambulation.  EKG showed persistent paced rhythm with no STEMI.  Clinically I do suspect patient has COVID given her symptoms and a report of a positive family member discovered today.  Given her age, lack of energy, decreased intake, diarrhea, will make sure she does not have AKI or significant lecture abnormality.  We will get x-ray to look for pneumonia.  We will ambulate patient she is not hypoxic I did have a shared decision-making conversation about management as she is 87 years old.  Anticipate reassessment after work-up.  6:36 PM Patient's work-up again to return.  Patient does indeed have COVID-19 as suspected.  I spoke with pharmacy and based on her history and medications currently, she is not a candidate for oral outpatient Paxlovid and based on our discussion, pharmacy feels that bebtilovamab is the best treatment for her at this time.   After this, she will be discharged home. Due to her reported recurrent UTIs and antibiotic intolerances, will give a dose of fosfomycin for this.   8:21 PM Patient received both the fosfomycin and the infusion without any complication or difficulties.  She will be discharged home to follow-up with her PCP.  She had no aggressions or concerns and was discharged in good condition with reassuring vital signs on reassessment.   Final Clinical Impression(s) / ED Diagnoses Final diagnoses:  COVID-19  Fatigue, unspecified type  Cough  Acute cystitis without hematuria    Rx / DC Orders ED Discharge Orders     None       Clinical Impression: 1. COVID-19   2. Fatigue, unspecified type   3. Cough   4. Acute cystitis without hematuria     Disposition: Discharge  Condition: Good  I have discussed the results, Dx and Tx plan with the pt(& family if present). He/she/they expressed understanding and agree(s) with the plan. Discharge instructions discussed at great length. Strict return  precautions discussed and pt &/or family have verbalized understanding of the instructions. No further questions at time of discharge.    New Prescriptions   No medications on file    Follow Up: Patria Mane, MD     [facility] [address] [id number] [place] [address] [phone]       Jaymes Hang, Canary Brim, MD 09/03/20 2024

## 2020-09-03 NOTE — ED Notes (Signed)
Pt urinated In her brief, unable to obtain sample. External urinary device applied

## 2020-09-03 NOTE — ED Notes (Signed)
While Resting O2 96% and HR 70 While ambulating O2 98% and HR 76

## 2020-09-06 LAB — URINE CULTURE: Culture: >=100000 — AB

## 2020-09-07 ENCOUNTER — Telehealth: Payer: Self-pay | Admitting: *Deleted

## 2020-09-07 NOTE — Telephone Encounter (Signed)
Post ED Visit - Positive Culture Follow-up  Culture report reviewed by antimicrobial stewardship pharmacist: [facility] []  , Pharm.D. []  Enzo Bi, Pharm.D., BCPS AQ-ID []  , Pharm.D., BCPS []  Celedonio Miyamoto, .D., BCPS []  [place], .D., BCPS, AAHIVP []  Georgina Pillion, Pharm.D., BCPS, AAHIVP []  [address], PharmD, BCPS []  , PharmD, BCPS []  [place], PharmD, BCPS []  [place], PharmD []  , PharmD, BCPS []  Estella Husk, PharmD  Pharmacy Team []  Lysle Pearl, PharmD []  , PharmD []  Phillips Climes, PharmD []  , Rph []  Agapito Games) , PharmD []  Verlan Friends, PharmD []  , PharmD []  Mervyn Gay, PharmD []  , PharmD []  Vinnie Level, PharmD []  [facility], PharmD []  , PharmD []  Len Childs, PharmD   Positive urine culture Treated with Fosfomycin in the ED, organism sensitive to the same and no further patient follow-up is required at this time.  [facility] 09/07/2020, 9:13 AM

## 2021-09-05 ENCOUNTER — Encounter (HOSPITAL_BASED_OUTPATIENT_CLINIC_OR_DEPARTMENT_OTHER): Payer: Self-pay | Admitting: Emergency Medicine

## 2021-09-05 ENCOUNTER — Emergency Department (HOSPITAL_BASED_OUTPATIENT_CLINIC_OR_DEPARTMENT_OTHER)
Admission: EM | Admit: 2021-09-05 | Discharge: 2021-09-05 | Disposition: A | Discharge status: Home / Self Care | Payer: Medicare HMO | Attending: Emergency Medicine | Admitting: Emergency Medicine

## 2021-09-05 ENCOUNTER — Emergency Department (HOSPITAL_BASED_OUTPATIENT_CLINIC_OR_DEPARTMENT_OTHER): Payer: Medicare HMO

## 2021-09-05 ENCOUNTER — Other Ambulatory Visit: Payer: Self-pay

## 2021-09-05 DIAGNOSIS — S79912A Unspecified injury of left hip, initial encounter: Secondary | ICD-10-CM | POA: Diagnosis present

## 2021-09-05 DIAGNOSIS — S7002XA Contusion of left hip, initial encounter: Secondary | ICD-10-CM | POA: Diagnosis not present

## 2021-09-05 DIAGNOSIS — Z91040 Latex allergy status: Secondary | ICD-10-CM | POA: Diagnosis not present

## 2021-09-05 DIAGNOSIS — W19XXXA Unspecified fall, initial encounter: Secondary | ICD-10-CM | POA: Diagnosis not present

## 2021-09-05 NOTE — ED Triage Notes (Addendum)
  Patient BIB daughter after a mechanical fall that occurred this morning around 1000.  Daughter states patient uses walker and got it caught on something in her bedroom while ambulating.  Patient fell on L hip and denies any other injury.  Patient been able to ambulate the rest of the day and daughter states she is at her baseline but wanted her to get checked out.  Patient states she did not hit her hed.  Does not take blood thinners. Pain 3/10, throbbing in L hip.

## 2021-09-05 NOTE — ED Provider Notes (Signed)
[facility] EMERGENCY DEPARTMENT Provider Note   CSN: [id number] Arrival date & time: 09/05/21  2004     History  Chief Complaint  Patient presents with   Kara Sanders is a 88 y.o. female.  The history is provided by the patient.  Fall  Kara Sanders is a 88 y.o. female who presents to the Emergency Department complaining of fall.  She presents to the emergency department accompanied by her daughter for evaluation of injuries following a fall that occurred this morning.  Daughter states that the patient was attempting to sit in the bed when she missed and fell, striking the floor.  She did require assistance to get up.  She complained of injuring her left hip and complains of pain in her hips bilaterally.  She at baseline ambulates only a small amount with her walker and has been able to ambulate at her baseline.  No reports of recent illnesses.  No fevers, chest pain, Donnell pain, nausea, vomiting, loss of consciousness.  She has a history of hypertension.  No blood thinners.      Home Medications Prior to Admission medications   Medication Sig Start Date End Date Taking? Authorizing Provider  acetaminophen (TYLENOL) 500 MG tablet Take 1,500 mg by mouth 2 (two) times daily as needed for headache (pain).    [provider]  albuterol (PROVENTIL) (2.5 MG/3ML) 0.083% nebulizer solution Take 2.5 mg by nebulization every 6 (six) hours as needed for wheezing.    [provider]  albuterol (VENTOLIN HFA) 108 (90 BASE) MCG/ACT inhaler Inhale 2 puffs into the lungs every 6 (six) hours as needed. Patient taking differently: Inhale 2 puffs into the lungs every 6 (six) hours as needed for wheezing.  10/18/10   Storm Frisk, MD  ALPRAZolam Prudy Feeler) 0.25 MG tablet Take 0.25 mg by mouth See admin instructions. Take one tablet (0.25 mg) by mouth every morning and two tablets (0.5 mg) at night as needed for anxiety/sleep 07/29/10   [provider]   amLODipine (NORVASC) 10 MG tablet Take 10 mg by mouth daily. 03/04/18   [provider]  calcium-vitamin D (OSCAL WITH D) 500-200 MG-UNIT tablet Take 2 tablets by mouth daily.    [provider]  carvedilol (COREG) 25 MG tablet Take 25 mg by mouth 2 (two) times daily with a meal.    [provider]  cloNIDine (CATAPRES) 0.1 MG tablet Take 0.1 mg by mouth 2 (two) times daily. 01/07/18   [provider]  cycloSPORINE (RESTASIS) 0.05 % ophthalmic emulsion Place 1 drop into both eyes 2 (two) times daily.    [provider]  dextromethorphan-guaiFENesin (MUCINEX DM) 30-600 MG per 12 hr tablet Take 1 tablet by mouth every 12 (twelve) hours.      [provider]  diclofenac sodium (VOLTAREN) 1 % GEL Apply topically.    [provider]  diphenhydrAMINE (BENADRYL) 25 MG tablet Take 1 tablet (25 mg total) by mouth every 6 (six) hours as needed for itching or allergies. 05/08/17   Fayrene Helper, PA-C  fluticasone (FLOVENT HFA) 110 MCG/ACT inhaler Inhale 1 puff into the lungs 2 (two) times daily.    [provider]  furosemide (LASIX) 20 MG tablet Take 20 mg by mouth 2 (two) times daily.     [provider]  isosorbide-hydrALAZINE (BIDIL) 20-37.5 MG tablet Take 1 tablet by mouth 2 (two) times daily.    [provider]  levocetirizine Elita Boone) 5  MG tablet Take 5 mg by mouth every evening.    [provider]  losartan (COZAAR) 100 MG tablet Take 100 mg by mouth daily.      [provider]  magnesium oxide (MAG-OX) 400 MG tablet Take 400 mg by mouth daily.    [provider]  mometasone-formoterol (DULERA) 100-5 MCG/ACT AERO Inhale 2 puffs into the lungs 2 (two) times daily. 11/14/10   Elsie Stain, MD  montelukast (SINGULAIR) 10 MG tablet Take 10 mg by mouth at bedtime.    [provider]  pantoprazole (PROTONIX) 40 MG tablet Take 40 mg by mouth daily before breakfast.     [provider]  potassium chloride SA (K-DUR,KLOR-CON) 20 MEQ tablet Take 20 mEq by mouth 2 (two) times daily.      [provider]  Propylene Glycol (SYSTANE BALANCE) 0.6 % SOLN Place 1 drop into both eyes.    [provider]  Psyllium (METAMUCIL) 30.9 % POWD Take 1 Scoop by mouth 2 (two) times daily as needed (constipation).     [provider]  sertraline (ZOLOFT) 100 MG tablet Take 150 mg by mouth daily. 03/30/18   [provider]  warfarin (COUMADIN) 5 MG tablet Take 5 mg by mouth as directed.      [provider]      Allergies    Ciprofloxacin, Codeine, Latex, Penicillins, Sulfonamide derivatives, and Tiotropium bromide monohydrate    Review of Systems   Review of Systems  All other systems reviewed and are negative.   Physical Exam Updated Vital Signs BP 125/60 (BP Location: Left Arm)   Pulse 70   Temp 98.3 F (36.8 C) (Oral)   Resp 20   Ht 5\' 5"  (1.651 m)   Wt 77.1 kg   SpO2 97%   BMI 28.29 kg/m  Physical Exam Vitals and nursing note reviewed.  Constitutional:      Appearance: She is well-developed.  HENT:     Head: Normocephalic and atraumatic.  Cardiovascular:     Rate and Rhythm: Normal rate and regular rhythm.     Heart sounds: No murmur heard. Pulmonary:     Effort: Pulmonary effort is normal. No respiratory distress.     Breath sounds: Normal breath sounds.  Abdominal:     Palpations: Abdomen is soft.     Tenderness: There is no abdominal tenderness. There is no guarding or rebound.  Musculoskeletal:     Comments: No bony tenderness to palpation over bilateral hips, knees.  2+ DP pulses bilaterally.  Patient is able to passively and actively range bilateral hips, knees.  Skin:    General: Skin is warm and dry.  Neurological:     Mental Status: She is alert.     Comments: Oriented to person, place.  Mildly confused.  5 out of 5 strength in all 4 extremities  Psychiatric:        Behavior: Behavior normal.      ED Results / Procedures / Treatments   Labs (all labs ordered are listed, but only abnormal results are displayed) Labs Reviewed - No data to display  EKG None  Radiology DG HIPS BILAT WITH PELVIS 3-4 VIEWS  Result Date: 09/05/2021 CLINICAL DATA:  Status post fall onto left hip. EXAM: DG HIP (WITH OR WITHOUT PELVIS) 3-4V BILAT COMPARISON:  None Available. FINDINGS: The bones are diffusely under mineralized. No evidence of acute fracture of the pelvis or hips. Both femoral heads are well seated. Intact pubic rami.  Pubic symphysis and sacroiliac joints are congruent. Benign-appearing area of sclerosis in the right proximal femur. IMPRESSION: No fracture of the pelvis or hips. Electronically Signed   By: Narda Rutherford M.D.   On: 09/05/2021 21:18    Procedures Procedures    Medications Ordered in ED Medications - No data to display  ED Course/ Medical Decision Making/ A&P                           Medical Decision Making  Patient here for evaluation of injuries following a fall that occurred earlier today.  Imaging is negative for acute fracture or dislocation-images personally reviewed and interpreted.  Patient does have osteopenia.  Discussed if patient has significant pain on weightbearing it is recommended that additional imaging is obtained.  Patient states that her pain is well controlled and daughter states that she can ambulate her baseline.  Feel she is stable at this point in time for discharge home with outpatient follow-up as needed.  Discussed OTC analgesic such as acetaminophen.  Return precautions discussed.        Final Clinical Impression(s) / ED Diagnoses Final diagnoses:  Fall, initial encounter  Contusion of left hip, initial encounter    Rx / DC Orders ED Discharge Orders     None         Tilden Fossa, MD 09/05/21 2314

## 2022-06-21 ENCOUNTER — Encounter (HOSPITAL_BASED_OUTPATIENT_CLINIC_OR_DEPARTMENT_OTHER): Payer: Self-pay

## 2022-06-21 ENCOUNTER — Emergency Department (HOSPITAL_BASED_OUTPATIENT_CLINIC_OR_DEPARTMENT_OTHER)
Admission: EM | Admit: 2022-06-21 | Discharge: 2022-06-21 | Disposition: A | Discharge status: Home / Self Care | Payer: Medicare HMO | Attending: Emergency Medicine | Admitting: Emergency Medicine

## 2022-06-21 ENCOUNTER — Other Ambulatory Visit: Payer: Self-pay

## 2022-06-21 DIAGNOSIS — N3090 Cystitis, unspecified without hematuria: Secondary | ICD-10-CM

## 2022-06-21 DIAGNOSIS — Z79899 Other long term (current) drug therapy: Secondary | ICD-10-CM | POA: Insufficient documentation

## 2022-06-21 DIAGNOSIS — Z7951 Long term (current) use of inhaled steroids: Secondary | ICD-10-CM | POA: Diagnosis not present

## 2022-06-21 DIAGNOSIS — I10 Essential (primary) hypertension: Secondary | ICD-10-CM | POA: Insufficient documentation

## 2022-06-21 DIAGNOSIS — Z91040 Latex allergy status: Secondary | ICD-10-CM | POA: Diagnosis not present

## 2022-06-21 DIAGNOSIS — R531 Weakness: Secondary | ICD-10-CM | POA: Diagnosis not present

## 2022-06-21 DIAGNOSIS — M25561 Pain in right knee: Secondary | ICD-10-CM | POA: Insufficient documentation

## 2022-06-21 DIAGNOSIS — M25562 Pain in left knee: Secondary | ICD-10-CM | POA: Insufficient documentation

## 2022-06-21 DIAGNOSIS — J449 Chronic obstructive pulmonary disease, unspecified: Secondary | ICD-10-CM | POA: Diagnosis not present

## 2022-06-21 LAB — CBC WITH DIFFERENTIAL/PLATELET
Abs Immature Granulocytes: 0.04 10*3/uL (ref 0.00–0.07)
Basophils Absolute: 0 10*3/uL (ref 0.0–0.1)
Basophils Relative: 0 %
Eosinophils Absolute: 0.1 10*3/uL (ref 0.0–0.5)
Eosinophils Relative: 2 %
HCT: 32.7 % — ABNORMAL LOW (ref 36.0–46.0)
Hemoglobin: 11 g/dL — ABNORMAL LOW (ref 12.0–15.0)
Immature Granulocytes: 1 %
Lymphocytes Relative: 28 %
Lymphs Abs: 1.9 10*3/uL (ref 0.7–4.0)
MCH: 33 pg (ref 26.0–34.0)
MCHC: 33.6 g/dL (ref 30.0–36.0)
MCV: 98.2 fL (ref 80.0–100.0)
Monocytes Absolute: 0.7 10*3/uL (ref 0.1–1.0)
Monocytes Relative: 10 %
Neutro Abs: 4 10*3/uL (ref 1.7–7.7)
Neutrophils Relative %: 59 %
Platelets: 117 10*3/uL — ABNORMAL LOW (ref 150–400)
RBC: 3.33 MIL/uL — ABNORMAL LOW (ref 3.87–5.11)
RDW: 15.8 % — ABNORMAL HIGH (ref 11.5–15.5)
WBC: 6.7 10*3/uL (ref 4.0–10.5)
nRBC: 0 % (ref 0.0–0.2)

## 2022-06-21 LAB — COMPREHENSIVE METABOLIC PANEL
ALT: 11 U/L (ref 0–44)
AST: 16 U/L (ref 15–41)
Albumin: 3.8 g/dL (ref 3.5–5.0)
Alkaline Phosphatase: 34 U/L — ABNORMAL LOW (ref 38–126)
Anion gap: 8 (ref 5–15)
BUN: 24 mg/dL — ABNORMAL HIGH (ref 8–23)
CO2: 22 mmol/L (ref 22–32)
Calcium: 9.1 mg/dL (ref 8.9–10.3)
Chloride: 109 mmol/L (ref 98–111)
Creatinine, Ser: 1.4 mg/dL — ABNORMAL HIGH (ref 0.44–1.00)
GFR, Estimated: 36 mL/min — ABNORMAL LOW (ref >60)
Glucose, Bld: 82 mg/dL (ref 70–99)
Potassium: 3.6 mmol/L (ref 3.5–5.1)
Sodium: 139 mmol/L (ref 135–145)
Total Bilirubin: 0.9 mg/dL (ref 0.3–1.2)
Total Protein: 7.6 g/dL (ref 6.5–8.1)

## 2022-06-21 LAB — URINALYSIS, ROUTINE W REFLEX MICROSCOPIC
Bilirubin Urine: NEGATIVE
Glucose, UA: NEGATIVE mg/dL
Ketones, ur: NEGATIVE mg/dL
Nitrite: NEGATIVE
Protein, ur: 30 mg/dL — AB
Specific Gravity, Urine: 1.02 (ref 1.005–1.030)
pH: 7 (ref 5.0–8.0)

## 2022-06-21 LAB — URINALYSIS, MICROSCOPIC (REFLEX)

## 2022-06-21 IMAGING — DX DG CHEST 1V PORT
1 series · 1 of 1 positions shown · non-contrast
Comparison: Single-view of the chest 08/07/2020 and 02/10/2020.

CLINICAL DATA: Fever, chills, shortness of breath and productive
cough. History of TKCDP-ET exposure.

EXAM:
PORTABLE CHEST 1 VIEW

[chest ap]
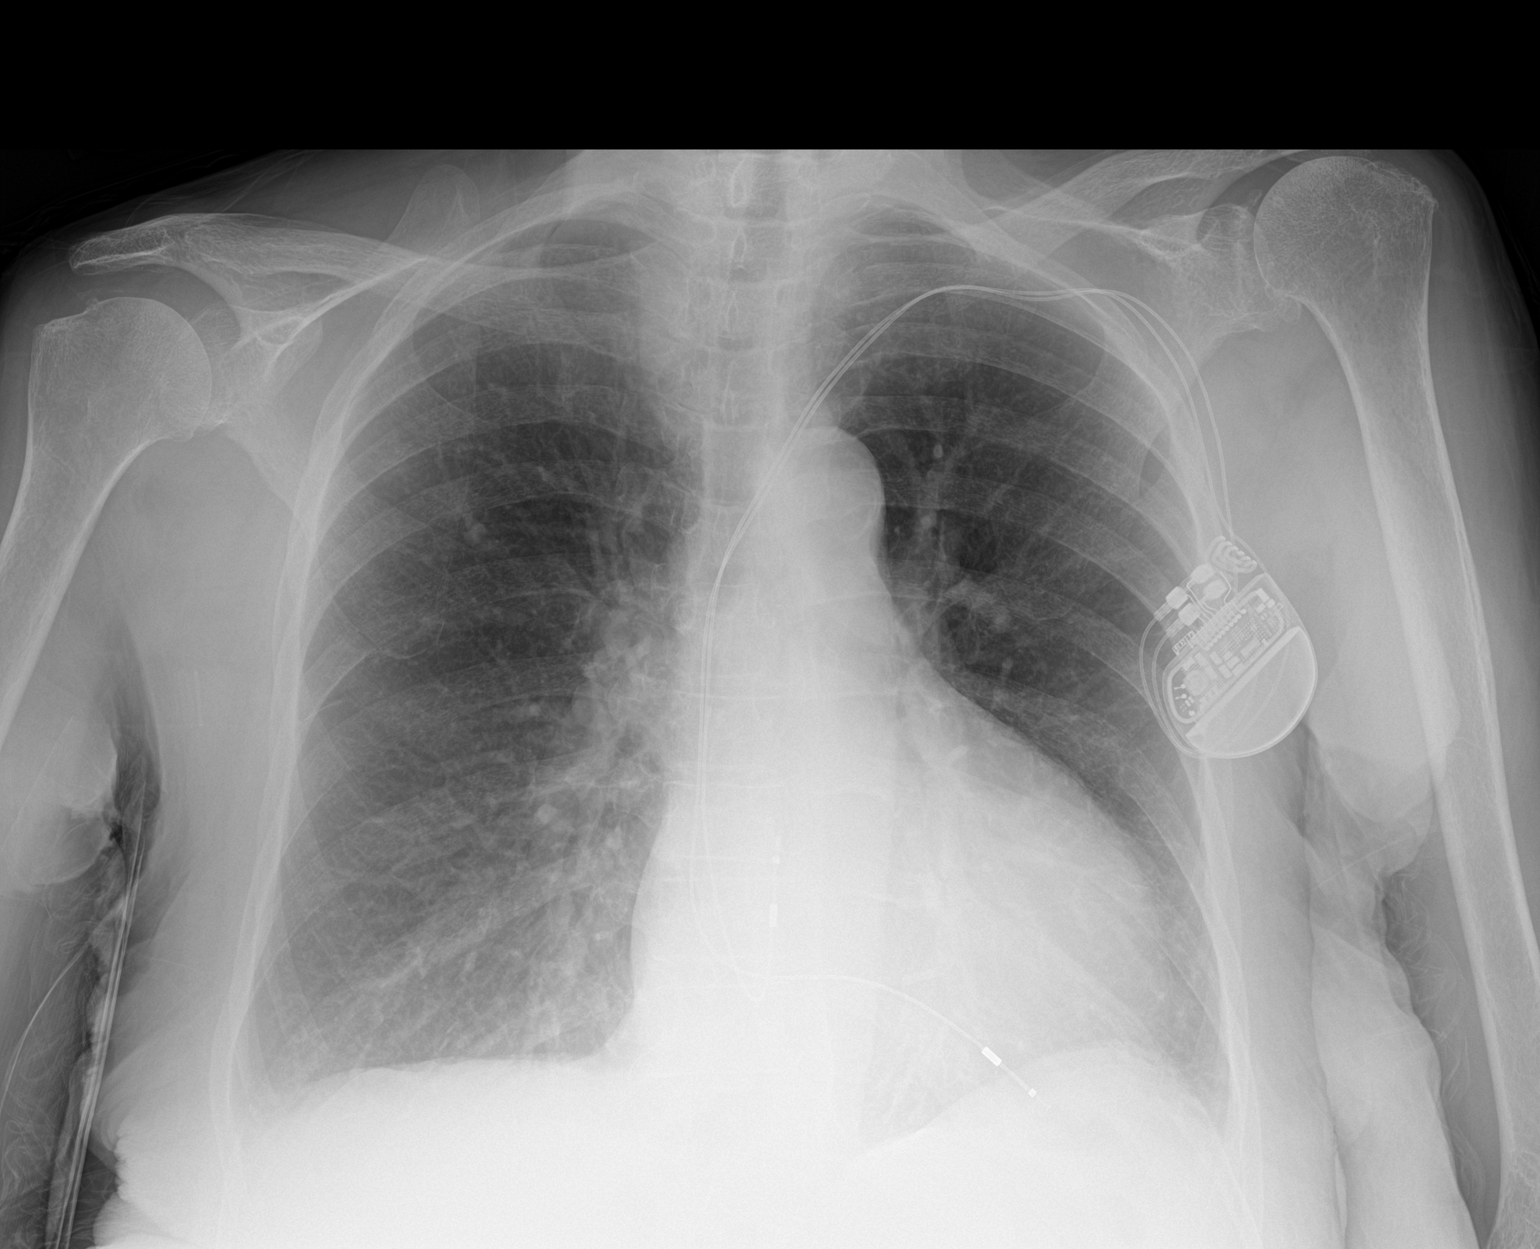

[1 of 1 positions shown; findings below may reference images not displayed]

FINDINGS: Two lead pacing device is unchanged. Lungs appear clear. Heart size
is upper normal. No pneumothorax or pleural fluid. No acute or focal
bony abnormality.
IMPRESSION: No acute disease.

## 2022-06-21 MED ORDER — CEFADROXIL 500 MG PO CAPS: 500.0000 mg | ORAL_CAPSULE | Freq: Two times a day (BID) | ORAL | 0 refills | Status: AC

## 2022-06-21 NOTE — Discharge Instructions (Signed)
Your urine does appear to still be infected.  We will send off your urine for a culture and can judge antibiotic therapy from there.  For now, I am going to place you on a broad-spectrum antibiotic which need you to take twice a day for the next 7 days or until we tell you to stop it once the culture grows out.  I would like for you to follow-up with your primary care doctor next week for further evaluation.  You may return to the emergency department for any worsening symptoms.

## 2022-06-21 NOTE — ED Notes (Signed)
Staff requested pt give them a urine sample. She refused and said "not now". Unsure of when she'll be willing to try.

## 2022-06-21 NOTE — ED Triage Notes (Signed)
Pt brought in by [facility]  from home with c/o bilateral knee pain this am after sleeping in chair last night , was unable to get up and from chair and was assisted by [facility] .  family requesting MRI  171/79 71 HR and 98 percent RA

## 2022-06-21 NOTE — ED Triage Notes (Signed)
When asked about pain, patient states her knees hurt "all the time"

## 2022-06-21 NOTE — ED Notes (Signed)
Spoke with daughter. She is requesting [facility] to pick up mother and take her home. Informed daughter that mother may not medically qualify for transport by [facility] and that she could be charged for the service. Daughter has not responded to voice message at this time.

## 2022-06-21 NOTE — ED Provider Notes (Signed)
[place] [facility] Provider Note   CSN: [id number] Arrival date & time: 06/21/22  1319     History Chief Complaint  Patient presents with   Knee Pain    Kara Sanders is a 89 y.o. 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  Daughter is at bedside provides some of the history.  She states that the patient was just recently treated for a urinary tract infection.  She states that she had a have 2 separate antibiotics the first was not effective.  She has taken all of the antibiotics to its entirety.  She noticed that really over the last week she has been progressed from the walker to the wheelchair and was having some issues getting from the recliner to the wheelchair last night and ended up sleeping in the recliner.  Patient also complaining of chronic bilateral knee pain.  She denies fever, chills, cough, congestion, shortness of breath.   Knee Pain      Home Medications Prior to Admission medications   Medication Sig Start Date End Date Taking? Authorizing Provider  cefadroxil (DURICEF) 500 MG capsule Take 1 capsule (500 mg total) by mouth 2 (two) times daily. 06/21/22  Yes Meredeth Ide, Sonnia Strong M, PA-C  acetaminophen (TYLENOL) 500 MG tablet Take 1,500 mg by mouth 2 (two) times daily as needed for headache (pain).    [provider]  albuterol (PROVENTIL) (2.5 MG/3ML) 0.083% nebulizer solution Take 2.5 mg by nebulization every 6 (six) hours as needed for wheezing.    [provider]  albuterol (VENTOLIN HFA) 108 (90 BASE) MCG/ACT inhaler Inhale 2 puffs into the lungs every 6 (six) hours as needed. Patient taking differently: Inhale 2 puffs into the lungs every 6 (six) hours as needed for wheezing.  10/18/10   Storm Frisk, MD  ALPRAZolam Prudy Feeler) 0.25 MG  tablet Take 0.25 mg by mouth See admin instructions. Take one tablet (0.25 mg) by mouth every morning and two tablets (0.5 mg) at night as needed for anxiety/sleep 07/29/10   [provider]  amLODipine (NORVASC) 10 MG tablet Take 10 mg by mouth daily. 03/04/18   [provider]  calcium-vitamin D (OSCAL WITH D) 500-200 MG-UNIT tablet Take 2 tablets by mouth daily.    [provider]  carvedilol (COREG) 25 MG tablet Take 25 mg by mouth 2 (two) times daily with a meal.    [provider]  cloNIDine (CATAPRES) 0.1 MG tablet Take 0.1 mg by mouth 2 (two) times daily. 01/07/18   [provider]  cycloSPORINE (RESTASIS) 0.05 % ophthalmic emulsion Place 1 drop into both eyes 2 (two) times daily.    [provider]  dextromethorphan-guaiFENesin (MUCINEX DM) 30-600 MG per 12 hr tablet Take 1 tablet by mouth every 12 (twelve) hours.      [provider]  diclofenac sodium (VOLTAREN) 1 % GEL Apply topically.    [provider]  diphenhydrAMINE (BENADRYL) 25 MG tablet Take 1 tablet (25 mg total) by mouth every 6 (six) hours as needed for itching or allergies. 05/08/17   Fayrene Helper, PA-C  fluticasone (FLOVENT HFA) 110 MCG/ACT inhaler Inhale 1 puff into the lungs 2 (two) times daily.    [provider]  furosemide (LASIX) 20 MG tablet Take 20 mg by mouth 2 (two) times daily.  [provider]  isosorbide-hydrALAZINE (BIDIL) 20-37.5 MG tablet Take 1 tablet by mouth 2 (two) times daily.    [provider]  levocetirizine (XYZAL) 5 MG tablet Take 5 mg by mouth every evening.    [provider]  losartan (COZAAR) 100 MG tablet Take 100 mg by mouth daily.      [provider]  magnesium oxide (MAG-OX) 400 MG tablet Take 400 mg by mouth daily.    [provider]  mometasone-formoterol (DULERA) 100-5 MCG/ACT AERO Inhale 2 puffs into the lungs 2 (two) times daily. 11/14/10   Storm Frisk, MD   montelukast (SINGULAIR) 10 MG tablet Take 10 mg by mouth at bedtime.    [provider]  pantoprazole (PROTONIX) 40 MG tablet Take 40 mg by mouth daily before breakfast.     [provider]  potassium chloride SA (K-DUR,KLOR-CON) 20 MEQ tablet Take 20 mEq by mouth 2 (two) times daily.      [provider]  Propylene Glycol (SYSTANE BALANCE) 0.6 % SOLN Place 1 drop into both eyes.    [provider]  Psyllium (METAMUCIL) 30.9 % POWD Take 1 Scoop by mouth 2 (two) times daily as needed (constipation).     [provider]  sertraline (ZOLOFT) 100 MG tablet Take 150 mg by mouth daily. 03/30/18   [provider]  warfarin (COUMADIN) 5 MG tablet Take 5 mg by mouth as directed.      [provider]      Allergies    Ciprofloxacin, Codeine, Latex, Penicillins, Sulfonamide derivatives, and Tiotropium bromide monohydrate    Review of Systems   Review of Systems  All other systems reviewed and are negative.   Physical Exam Updated Vital Signs BP (!) 176/61   Pulse 70   Temp 98.1 F (36.7 C) (Oral)   Resp 15   Ht 5\' 5"  (1.651 m)   Wt 81.6 kg   SpO2 96%   BMI 29.95 kg/m  Physical Exam Vitals and nursing note reviewed.  Constitutional:      General: She is not in acute distress.    Appearance: Normal appearance.  HENT:     Head: Normocephalic and atraumatic.  Eyes:     General:        Right eye: No discharge.        Left eye: No discharge.  Cardiovascular:     Comments: Regular rate and rhythm.  S1/S2 are distinct without any evidence of murmur, rubs, or gallops.  Radial pulses are 2+ bilaterally.  Dorsalis pedis pulses are 2+ bilaterally.  No evidence of pedal edema. Pulmonary:     Comments: Faint expiratory wheeze.  Normal effort.  Breath sounds equal. Abdominal:     General: Abdomen is flat. Bowel sounds are normal. There is no distension.     Tenderness: There is no abdominal tenderness. There is no guarding or rebound.   Musculoskeletal:        General: Normal range of motion.     Cervical back: Neck supple.  Skin:    General: Skin is warm and dry.     Findings: No rash.  Neurological:     General: No focal deficit present.     Mental Status: She is alert.  Psychiatric:        Mood and Affect: Mood normal.        Behavior: Behavior normal.     ED Results / Procedures / Treatments   Labs (all labs ordered are  listed, but only abnormal results are displayed) Labs Reviewed  CBC WITH DIFFERENTIAL/PLATELET - Abnormal; Notable for the following components:      Result Value   RBC 3.33 (*)    Hemoglobin 11.0 (*)    HCT 32.7 (*)    RDW 15.8 (*)    Platelets 117 (*)    All other components within normal limits  COMPREHENSIVE METABOLIC PANEL - Abnormal; Notable for the following components:   BUN 24 (*)    Creatinine, Ser 1.40 (*)    Alkaline Phosphatase 34 (*)    GFR, Estimated 36 (*)    All other components within normal limits  URINALYSIS, ROUTINE W REFLEX MICROSCOPIC - Abnormal; Notable for the following components:   Hgb urine dipstick TRACE (*)    Protein, ur 30 (*)    Leukocytes,Ua LARGE (*)    All other components within normal limits  URINALYSIS, MICROSCOPIC (REFLEX) - Abnormal; Notable for the following components:   Bacteria, UA MANY (*)    All other components within normal limits  URINE CULTURE    EKG None  Radiology No results found.  Procedures Procedures    Medications Ordered in ED Medications - No data to display  ED Course/ Medical Decision Making/ A&P Clinical Course as of 06/21/22 1923  Thu Jun 21, 2022  1902 Urinalysis, Routine w reflex microscopic -Urine, Clean Catch(!) There is indication of a urinary tract infection. [CF]  1902 CBC with Differential(!) No evidence of leukocytosis.  There is some evidence of anemia but this seems to be at the patient's baseline. [CF]  1903 Comprehensive metabolic panel(!) Normal.  [CF]    Clinical Course User  Index [CF] Teressa Lower, PA-C   {   Click here for ABCD2, HEART and other calculators  Medical Decision Making ORCHID GLASSBERG is a 89 y.o. female patient who presents to the emergency department for further evaluation of generalized weakness.  Concern that the patient recently just was treated for urinary tract infection I suspect this might be a probable cause of her generalized weakness.  Patient does have faint expiratory wheeze but does have a history of moderate COPD.  Patient's oxygenation is 95% on room air and she is afebrile.  She is hypertensive here.  I will plan to get some basic labs and retest for a urine.  Will also send off for a culture.  Patient is in no acute distress at this time.  He is some difficulty obtaining urine as the patient to not have to pee for some time.  Ultimately, after getting the urine back it does appear to still be infected.  We have sent the urine off for culture.  I am going to place the patient on a cephalosporin.  She does have a penicillin allergy listed but I do not see what the adverse reaction is and this was back in 2009.  The family does not know the reaction either and the patient seems to think that she does not have a penicillin allergy.  Nonetheless ongoing to give her a cephalosporin with strict precautions.  She will follow-up with her primary care doctor for further evaluation. Strict return precautions given. She is safe for discharge.   Amount and/or Complexity of Data Reviewed Labs: ordered. Decision-making details documented in ED Course.  Risk Prescription drug management.    Final Clinical Impression(s) / ED Diagnoses Final diagnoses:  General weakness  Cystitis    Rx / DC Orders ED Discharge Orders  Ordered    cefadroxil (DURICEF) 500 MG capsule  2 times daily        06/21/22 1907              Teressa Lower, [place] 06/21/22 1923    Rozelle Logan, [place] 07/10/22 1610

## 2022-06-23 LAB — URINE CULTURE

## 2022-06-24 LAB — URINE CULTURE

## 2022-06-28 LAB — URINE CULTURE: Culture: 50000 — AB

## 2022-06-29 ENCOUNTER — Telehealth (HOSPITAL_COMMUNITY): Payer: Self-pay | Admitting: Pharmacy Technician

## 2022-06-29 ENCOUNTER — Telehealth (HOSPITAL_BASED_OUTPATIENT_CLINIC_OR_DEPARTMENT_OTHER): Payer: Self-pay | Admitting: *Deleted

## 2022-06-29 ENCOUNTER — Other Ambulatory Visit (HOSPITAL_COMMUNITY): Payer: Self-pay

## 2022-06-29 NOTE — Telephone Encounter (Signed)
Patient Advocate Encounter  Prior Authorization for Fosfomycin Tromethamine 3GM packets has been approved.    PA# [id number] Insurance [employer] Electronic PA Form Effective dates: 06/29/2022 through 02/26/2023  Patients co-pay is $0.00.     Roland Earl, CPhT Pharmacy Patient Advocate Specialist [facility] Patient Advocate Team Direct Number: [phone]  Fax: [phone]

## 2022-06-29 NOTE — Telephone Encounter (Signed)
Patient Advocate Encounter   Received notification that prior authorization for Fosfomycin Tromethamine 3GM packets is required.   PA submitted on 06/29/2022 Key [id number] Insurance [employer] Electronic PA Form Status is pending       Roland Earl, CPhT Pharmacy Patient Advocate Specialist [facility] Patient Advocate Team Direct Number: [phone]  Fax: [phone]

## 2022-06-29 NOTE — Progress Notes (Signed)
ED Antimicrobial Stewardship Positive Culture Follow Up   Kara Sanders is an 89 y.o. female who presented to [facility] on 06/21/2022 with a chief complaint of  Chief Complaint  Patient presents with   Knee Pain    Recent Results (from the past 720 hour(s))  Urine Culture     Status: Abnormal   Collection Time: 06/21/22  2:35 PM   Specimen: Urine, Clean Catch  Result Value Ref Range Status   Specimen Description   Final    URINE, CLEAN CATCH Performed at [facility], [address] [place], [place] [address]    Special Requests   Final    NONE Performed at [facility] [address] [place], [place] [address]    Culture (A)  Final    50,000 COLONIES/mL ESCHERICHIA COLI 40,000 COLONIES/mL PROTEUS MIRABILIS 60,000 COLONIES/mL ENTEROCOCCUS FAECALIS    Report Status 06/28/2022 FINAL  Final   Organism ID, Bacteria ESCHERICHIA COLI (A)  Final   Organism ID, Bacteria PROTEUS MIRABILIS (A)  Final   Organism ID, Bacteria ENTEROCOCCUS FAECALIS (A)  Final      Susceptibility   Escherichia coli - MIC*    AMPICILLIN <=2 SENSITIVE Sensitive     CEFAZOLIN <=4 SENSITIVE Sensitive     CEFEPIME <=0.12 SENSITIVE Sensitive     CEFTRIAXONE <=0.25 SENSITIVE Sensitive     CIPROFLOXACIN <=0.25 SENSITIVE Sensitive     GENTAMICIN <=1 SENSITIVE Sensitive     IMIPENEM <=0.25 SENSITIVE Sensitive     NITROFURANTOIN <=16 SENSITIVE Sensitive     TRIMETH/SULFA <=20 SENSITIVE Sensitive     AMPICILLIN/SULBACTAM <=2 SENSITIVE Sensitive     PIP/TAZO <=4 SENSITIVE Sensitive     * 50,000 COLONIES/mL ESCHERICHIA COLI   Enterococcus faecalis - MIC*    AMPICILLIN <=2 SENSITIVE Sensitive     NITROFURANTOIN <=16 SENSITIVE Sensitive     VANCOMYCIN 1 SENSITIVE Sensitive     * 60,000 COLONIES/mL ENTEROCOCCUS FAECALIS   Proteus mirabilis - MIC*    AMPICILLIN <=2 SENSITIVE Sensitive     CEFAZOLIN <=4 SENSITIVE Sensitive     CEFEPIME <=0.12 SENSITIVE Sensitive     CEFTRIAXONE <=0.25  SENSITIVE Sensitive     CIPROFLOXACIN <=0.25 SENSITIVE Sensitive     GENTAMICIN <=1 SENSITIVE Sensitive     IMIPENEM 2 SENSITIVE Sensitive     NITROFURANTOIN 128 RESISTANT Resistant     TRIMETH/SULFA <=20 SENSITIVE Sensitive     AMPICILLIN/SULBACTAM <=2 SENSITIVE Sensitive     PIP/TAZO <=4 SENSITIVE Sensitive     * 40,000 COLONIES/mL PROTEUS MIRABILIS    [x]  Treated with cefadroxil, organism resistant to prescribed antimicrobial  New antibiotic prescription: Flow Manager to call patient. If feeling better with no further urinary symptoms, no further treatment indicated after previous cefadroxil course completed. If patient still experiencing urinary symptoms, give fosfomycin 3g PO x 1 dose.  ED Provider: Elson Areas, PA-C   Susa Griffins Dohlen 06/29/2022, 8:13 AM Clinical Pharmacist Monday - Friday phone -  [phone] Saturday - Sunday phone - [phone]

## 2022-06-29 NOTE — TOC Benefit Eligibility Note (Signed)
Patient Product/process development scientist completed.    The patient is currently admitted and upon discharge could be taking fosfomycin (Monurol) 3 g pack.  Not on Formulary Try SULFAMETHOXAZOLE-TRIMETHOPRIM, CEPHALEXIN 250 MG CAPSULE,CEPHALEXIN 500 MG CAPSULE  The patient is insured through [facility] Part D   This test claim was processed through [facility]- copay amounts may vary at other pharmacies due to pharmacy/plan contracts, or as the patient moves through the different stages of their insurance plan.  Roland Earl, CPHT Pharmacy Patient Advocate Specialist [facility] Patient Advocate Team Direct Number: [phone]  Fax: [phone]

## 2022-06-29 NOTE — Telephone Encounter (Signed)
Post ED Visit - Positive Culture Follow-up: Unsuccessful Patient Follow-up  Culture assessed and recommendations reviewed by:  [x]  Francene Finders Dohler, Pharm.D. []  Celedonio Miyamoto, Pharm.D., BCPS AQ-ID []  Garvin Fila, Pharm.D., BCPS []  Georgina Pillion, Pharm.D., BCPS []  [place], [place].D., BCPS, AAHIVP []  Estella Husk, Pharm.D., BCPS, AAHIVP []  Sherlynn Carbon, PharmD []  Pollyann Samples, PharmD, BCPS  Positive urine culture  []  Patient discharged without antimicrobial prescription and treatment is now indicated [x]  Organism is resistant to prescribed ED discharge antimicrobial []  Patient with positive blood cultures  Plan: If patient still experinceng urinary symptoms,start Fofomycin 3g x 1 dose. If feeling better, no further treatment indicated. per ED Provider: Elson Areas, PA-C    Unable to contact patient after 3 attempts, letter will be sent to address on file  Bing Quarry 06/29/2022, 10:28 AM

## 2022-06-29 NOTE — Telephone Encounter (Signed)
Pharmacy Patient Advocate Encounter  Insurance verification completed.    The patient is insured through [employer] Part D   The patient is currently admitted and ran test claims for the following: fosfomycin (Monurol) .  Copays and coinsurance results were relayed to Inpatient clinical team.  

## 2022-07-27 ENCOUNTER — Telehealth (HOSPITAL_BASED_OUTPATIENT_CLINIC_OR_DEPARTMENT_OTHER): Payer: Self-pay | Admitting: Emergency Medicine
